# Patient Record
Sex: Male | Born: 1955 | Race: White | Hispanic: No | Smoking: Heavy tobacco smoker
Health system: Southern US, Community
[De-identification: ages and names within clinical notes are randomized; demographics above are authoritative.]

## PROBLEM LIST (undated history)

## (undated) DIAGNOSIS — E119 Type 2 diabetes mellitus without complications: Secondary | ICD-10-CM

## (undated) DIAGNOSIS — I10 Essential (primary) hypertension: Secondary | ICD-10-CM

## (undated) DIAGNOSIS — H269 Unspecified cataract: Secondary | ICD-10-CM

## (undated) HISTORY — PX: KNEE SURGERY: SHX244

## (undated) HISTORY — DX: Unspecified cataract: H26.9

## (undated) HISTORY — DX: Essential (primary) hypertension: I10

## (undated) HISTORY — DX: Type 2 diabetes mellitus without complications: E11.9

## (undated) HISTORY — PX: PROSTATE BIOPSY: SHX241

---

## 2015-02-25 ENCOUNTER — Emergency Department
Admission: EM | Admit: 2015-02-25 | Discharge: 2015-02-25 | Disposition: A | Discharge status: Home / Self Care | Payer: Managed Care, Other (non HMO) | Source: Home / Self Care | Attending: Family Medicine | Admitting: Family Medicine

## 2015-02-25 ENCOUNTER — Emergency Department (INDEPENDENT_AMBULATORY_CARE_PROVIDER_SITE_OTHER): Payer: Managed Care, Other (non HMO)

## 2015-02-25 DIAGNOSIS — R490 Dysphonia: Secondary | ICD-10-CM

## 2015-02-25 DIAGNOSIS — M5030 Other cervical disc degeneration, unspecified cervical region: Secondary | ICD-10-CM

## 2015-02-25 DIAGNOSIS — R030 Elevated blood-pressure reading, without diagnosis of hypertension: Secondary | ICD-10-CM

## 2015-02-25 DIAGNOSIS — IMO0001 Reserved for inherently not codable concepts without codable children: Secondary | ICD-10-CM

## 2015-02-25 MED ORDER — LISINOPRIL 20 MG PO TABS: 20.0000 mg | ORAL_TABLET | Freq: Every day | ORAL | Status: DC

## 2015-02-25 NOTE — ED Provider Notes (Signed)
CSN: [id number]     Arrival date & time 02/25/15  1507 History   First MD Initiated Contact with Patient 02/25/15 1510     Chief Complaint  Patient presents with  . Hoarse   (Consider location/radiation/quality/duration/timing/severity/associated sxs/prior Treatment) HPI Pt is a 60yo male presenting to [facility] with c/ow hoarseness to his voice for 1 week. Pt states about 1 week ago he drank water that went down the "wrong way" which caused him to cough violently. Pt states the next morning he did not have a voice. He denies any other symptoms. Denies throat pain. Denies cough, congestion, difficulty breathing or swallowing. No sick contacts or recent travel. Denies recent yelling or screaming.  History reviewed. No pertinent past medical history. Past Surgical History  Procedure Laterality Date  . Knee surgery      age 37   Family History  Problem Relation Age of Onset  . Diabetes Mother    Social History  Substance Use Topics  . Smoking status: Heavy Tobacco Smoker -- 5.00 packs/day for 0 years    Types: Cigars  . Smokeless tobacco: None  . Alcohol Use: No    Review of Systems  Constitutional: Negative for fever, chills, diaphoresis, appetite change and fatigue.  HENT: Positive for voice change. Negative for congestion, postnasal drip, rhinorrhea, sinus pressure, sore throat and trouble swallowing.   Respiratory: Positive for choking ( choked on water 1 week ago). Negative for cough, chest tightness, shortness of breath, wheezing and stridor.   Cardiovascular: Negative for chest pain and palpitations.  Gastrointestinal: Negative for nausea, vomiting, abdominal pain and diarrhea.  Musculoskeletal: Negative for myalgias and back pain.    Allergies  Review of patient's allergies indicates no known allergies.  Home Medications   Prior to Admission medications   Medication Sig Start Date End Date Taking? Authorizing Provider  lisinopril (PRINIVIL,ZESTRIL) 20 MG tablet Take 1  tablet (20 mg total) by mouth daily. 02/25/15   Noland Fordyce, PA-C   BP 195/108 mmHg  Pulse 82  Temp(Src) 98.4 F (36.9 C) (Oral)  Ht 5\' 8"  (1.727 m)  Wt 212 lb (96.163 kg)  BMI 32.24 kg/m2  SpO2 97% Physical Exam  Constitutional: He appears well-developed and well-nourished.  HENT:  Head: Normocephalic and atraumatic.  Right Ear: Hearing, tympanic membrane, external ear and ear canal normal.  Left Ear: Hearing, tympanic membrane, external ear and ear canal normal.  Nose: Nose normal.  Mouth/Throat: Uvula is midline and mucous membranes are normal. Posterior oropharyngeal erythema present. No oropharyngeal exudate, posterior oropharyngeal edema or tonsillar abscesses.  Eyes: Conjunctivae are normal. No scleral icterus.  Neck: Normal range of motion. Neck supple. No JVD present. No tracheal deviation present. No thyromegaly present.  No stridor but hoarse/squeaking quality of voice when speaking.  Cardiovascular: Normal rate, regular rhythm and normal heart sounds.   Pulmonary/Chest: Effort normal and breath sounds normal. No stridor. No respiratory distress. He has no wheezes. He has no rales. He exhibits no tenderness.  Abdominal: Soft. He exhibits no distension. There is no tenderness.  Musculoskeletal: Normal range of motion.  Lymphadenopathy:    He has no cervical adenopathy.  Neurological: He is alert.  Skin: Skin is warm and dry.  Nursing note and vitals reviewed.   ED Course  Procedures (including critical care time) Labs Review Labs Reviewed - No data to display  Imaging Review Dg Neck Soft Tissue  02/25/2015   CLINICAL DATA:  Hoarseness, lost a scoliosis for a few days, feels like neck  a swollen, trouble swallowing liquids every 4-5 days, history smoking, hypertension  EXAM: NECK SOFT TISSUES - 1+ VIEW  COMPARISON:  None  FINDINGS: Prevertebral soft tissues normal thickness.  Epiglottis and aryepiglottic folds normal thickness.  Airway patent.  Scattered degenerative  disc disease changes thoracic spine.  Atherosclerotic calcification aorta.  Calcified RIGHT paratracheal adenopathy.  IMPRESSION: Degenerative disc disease changes cervical spine.  No acute soft tissue abnormalities.   Electronically Signed   By: Lavonia Dana M.D.   On: 02/25/2015 15:54   Dg Chest 2 View  02/25/2015   CLINICAL DATA:  Hoarseness for several days. Difficulty swallowing fluids for the last 4-5 days.  EXAM: CHEST  2 VIEW  COMPARISON:  None.  FINDINGS: There is a eventration of the right hemidiaphragm.  Cardiomediastinal silhouette is normal. Mediastinal contours appear intact. There are high density mildly prominent lymph nodes in the right hilum.  There is no evidence of focal airspace consolidation, pleural effusion or pneumothorax.  Osseous structures are without acute abnormality. Soft tissues are grossly normal.  IMPRESSION: Eventration of the right hemidiaphragm.  Mildly prominent right hilar lymph nodes, possibly calcified. Further evaluation with CT of the thorax may be considered if found clinically necessary.   Electronically Signed   By: Fidela Salisbury M.D.   On: 02/25/2015 15:53     MDM   1. Elevated blood pressure   2. Hoarseness of voice    Pt c/o hoarse voice for 1 week after choking on water. No other symptoms. Denies difficulty breathing or swallowing.  Pt also noted to have elevated BP at 195/108.  Pt states he has not seen a PCP in 25 years.  Denies CP or SOB. No daily medications.  Plain films of soft tissue neck and CXR: negative for acute cardiopulmonary findings. No evidence of airway obstruction.  Pt likely has laryngitis from viscous cough after choking on water. Encouraged voice rest. May drink warm liquids like tea to help sooth throat. Will start pt on Lisinopril for elevated BP. Strongly encouraged pt establish care with a PCP for recheck of BP in 1-2 weeks and PCP to f/u on pt's voice.   Discussed symptoms that warrant emergent care in the  ED. Patient verbalized understanding and agreement with treatment plan.      Noland Fordyce, PA-C 02/25/15 1654

## 2015-02-25 NOTE — Discharge Instructions (Signed)
Please call to schedule a follow up appointment with primary care for blood pressure recheck in 1-2 weeks as well as for ongoing healthcare needs.

## 2015-02-25 NOTE — ED Notes (Signed)
To x-ray

## 2015-02-25 NOTE — ED Notes (Signed)
Lost voice x 1 week, drank water went down wrong, started coughing, and the next morning did not have a voice

## 2015-03-01 ENCOUNTER — Telehealth: Payer: Self-pay

## 2015-03-19 ENCOUNTER — Encounter: Payer: Self-pay | Admitting: Osteopathic Medicine

## 2015-03-19 ENCOUNTER — Ambulatory Visit (INDEPENDENT_AMBULATORY_CARE_PROVIDER_SITE_OTHER): Payer: Managed Care, Other (non HMO) | Admitting: Osteopathic Medicine

## 2015-03-19 VITALS — BP 158/84 | HR 87 | Ht 68.0 in | Wt 210.0 lb

## 2015-03-19 DIAGNOSIS — Z131 Encounter for screening for diabetes mellitus: Secondary | ICD-10-CM | POA: Diagnosis not present

## 2015-03-19 DIAGNOSIS — I10 Essential (primary) hypertension: Secondary | ICD-10-CM | POA: Diagnosis not present

## 2015-03-19 DIAGNOSIS — Z13220 Encounter for screening for lipoid disorders: Secondary | ICD-10-CM | POA: Diagnosis not present

## 2015-03-19 DIAGNOSIS — Z87891 Personal history of nicotine dependence: Secondary | ICD-10-CM

## 2015-03-19 DIAGNOSIS — Z716 Tobacco abuse counseling: Secondary | ICD-10-CM

## 2015-03-19 DIAGNOSIS — R490 Dysphonia: Secondary | ICD-10-CM | POA: Insufficient documentation

## 2015-03-19 DIAGNOSIS — Z720 Tobacco use: Secondary | ICD-10-CM

## 2015-03-19 DIAGNOSIS — Z114 Encounter for screening for human immunodeficiency virus [HIV]: Secondary | ICD-10-CM

## 2015-03-19 DIAGNOSIS — Z1159 Encounter for screening for other viral diseases: Secondary | ICD-10-CM

## 2015-03-19 LAB — COMPLETE METABOLIC PANEL WITH GFR
ALBUMIN: 4.3 g/dL (ref 3.6–5.1)
ALK PHOS: 69 U/L (ref 40–115)
ALT: 17 U/L (ref 9–46)
AST: 15 U/L (ref 10–35)
BILIRUBIN TOTAL: 0.5 mg/dL (ref 0.2–1.2)
BUN: 25 mg/dL (ref 7–25)
CALCIUM: 10.1 mg/dL (ref 8.6–10.3)
CO2: 24 mmol/L (ref 20–31)
CREATININE: 1.17 mg/dL (ref 0.70–1.33)
Chloride: 102 mmol/L (ref 98–110)
GFR, Est African American: 79 mL/min (ref >=60)
GFR, Est Non African American: 68 mL/min (ref >=60)
GLUCOSE: 198 mg/dL — AB (ref 65–99)
Potassium: 5.2 mmol/L (ref 3.5–5.3)
SODIUM: 136 mmol/L (ref 135–146)
TOTAL PROTEIN: 7.7 g/dL (ref 6.1–8.1)

## 2015-03-19 LAB — LIPID PANEL
Cholesterol: 208 mg/dL — ABNORMAL HIGH (ref 125–200)
HDL: 28 mg/dL — ABNORMAL LOW (ref >=40)
LDL CALC: 132 mg/dL — AB (ref <130)
TRIGLYCERIDES: 241 mg/dL — AB (ref <150)
Total CHOL/HDL Ratio: 7.4 Ratio — ABNORMAL HIGH (ref <=5.0)
VLDL: 48 mg/dL — AB (ref <30)

## 2015-03-19 LAB — CBC WITH DIFFERENTIAL/PLATELET
BASOS PCT: 1 % (ref 0–1)
Basophils Absolute: 0.1 10*3/uL (ref 0.0–0.1)
Eosinophils Absolute: 0.3 10*3/uL (ref 0.0–0.7)
Eosinophils Relative: 3 % (ref 0–5)
HEMATOCRIT: 49.3 % (ref 39.0–52.0)
HEMOGLOBIN: 17.1 g/dL — AB (ref 13.0–17.0)
LYMPHS PCT: 18 % (ref 12–46)
Lymphs Abs: 1.8 10*3/uL (ref 0.7–4.0)
MCH: 30 pg (ref 26.0–34.0)
MCHC: 34.7 g/dL (ref 30.0–36.0)
MCV: 86.5 fL (ref 78.0–100.0)
MONO ABS: 0.8 10*3/uL (ref 0.1–1.0)
MPV: 10.5 fL (ref 8.6–12.4)
Monocytes Relative: 8 % (ref 3–12)
NEUTROS ABS: 7.1 10*3/uL (ref 1.7–7.7)
NEUTROS PCT: 70 % (ref 43–77)
Platelets: 317 10*3/uL (ref 150–400)
RBC: 5.7 MIL/uL (ref 4.22–5.81)
RDW: 13.5 % (ref 11.5–15.5)
WBC: 10.2 10*3/uL (ref 4.0–10.5)

## 2015-03-19 LAB — HEMOGLOBIN A1C
HEMOGLOBIN A1C: 11.3 % — AB (ref <5.7)
MEAN PLASMA GLUCOSE: 278 mg/dL — AB (ref <117)

## 2015-03-19 LAB — TSH: TSH: 1.049 u[IU]/mL (ref 0.350–4.500)

## 2015-03-19 MED ORDER — PREDNISONE 10 MG (21) PO TBPK: ORAL_TABLET | ORAL | Status: DC

## 2015-03-19 NOTE — Progress Notes (Signed)
HPI: Jesse Calhoun is a 59 y.o. male who presents to [facility]  today for chief complaint of:  Chief Complaint  Patient presents with  . Establish Care    pcp  . Hypertension  . Hoarse    HOARSENESS . Severity: marked, "I sound like Marge Simpson" . Duration: 3 weeks . Timing: constant . Context: started with drinking water, sudden onset . Modifying factors: nothing makes better/worse . Assoc signs/symptoms: no pain/infection  HTN - hypertension newly diagnosed 3 weeks ago at urgent care, no chest pain/pressure, no shortness of breath. Patient tolerating lisinopril well.  Has not seen a physician in over 20 years, no blood work and record. No other complaints today.   Past medical, social and family history reviewed: No past medical history on file. Past Surgical History  Procedure Laterality Date  . Knee surgery      age 30   Social History  Substance Use Topics  . Smoking status: Heavy Tobacco Smoker -- 5.00 packs/day for 0 years    Types: Cigars  . Smokeless tobacco: Not on file  . Alcohol Use: No   Family History  Problem Relation Age of Onset  . Diabetes Mother     Current Outpatient Prescriptions  Medication Sig Dispense Refill  . lisinopril (PRINIVIL,ZESTRIL) 20 MG tablet Take 1 tablet (20 mg total) by mouth daily. 30 tablet 0   No current facility-administered medications for this visit.   No Known Allergies    Review of Systems: CONSTITUTIONAL: Neg fever/chills, no unintentional weight changes HEAD/EYES/EARS/NOSE/THROAT: No headache/vision change or hearing change, no sore throat. (+) HOARSENESS AS PER HPI, cataracts CARDIAC: No chest pain/pressure/palpitations, no orthopnea RESPIRATORY: No cough/shortness of breath/wheeze GASTROINTESTINAL: No nausea/vomiting/abdominal pain/blood in stool/diarrhea/constipation MUSCULOSKELETAL: No myalgia/arthralgia GENITOURINARY: No incontinence, No abnormal genital  bleeding/discharge SKIN: No rash/wounds/concerning lesions HEM/ONC: No easy bruising/bleeding, no abnormal lymph node ENDOCRINE: No polyuria/polydipsia/polyphagia, no heat/cold intolerance  NEUROLOGIC: No weakness/dizzines/slurred speech PSYCHIATRIC: No concerns with depression/anxiety or sleep problems    Exam:  BP 158/84 mmHg  Pulse 87  Ht 5\' 8"  (1.727 m)  Wt 210 lb (95.255 kg)  BMI 31.94 kg/m2  SpO2 98% Constitutional: VSS, see above. General Appearance: alert, well-developed, well-nourished, NAD. Hoarse voice, higher pitch vibratory quality to voice Eyes: Normal lids and conjunctive, non-icteric sclera, PERRLA Ears, Nose, Mouth, Throat: Normal external inspection ears/nares/mouth/lips/gums, Normal TM bilaterally, MMM, posterior pharynx without erythema/exudate Neck: No masses, trachea midline. No thyroid enlargement/tenderness/mass appreciated Respiratory: Normal respiratory effort. No dullness/hyper-resonance to percussion. Breath sounds normal, no wheeze/rhonchi/rales Cardiovascular: S1/S2 normal, no murmur/rub/gallop auscultated. RRR. No carotid bruit or JVD. No abdominal aortic bruit. Pedal pulse II/IV bilaterally DP and PT. No lower extremity edema. Gastrointestinal: Nontender, no masses. No hepatomegaly, no splenomegaly. No hernia appreciated. Rectal exam deferred.  Musculoskeletal: Gait normal. No clubbing/cyanosis of digits.  Neurological: No cranial nerve deficit on limited exam. Motor and sensation intact and symmetric Psychiatric: Normal judgment/insight. Normal mood and affect. Oriented x3.    No results found for this or any previous visit (from the past 72 hour(s)).    ASSESSMENT/PLAN:  Essential hypertension - Plan: CBC with Differential/Platelet, COMPLETE METABOLIC PANEL WITH GFR, Lipid panel, Hemoglobin A1C  Hoarseness of voice - Plan: TSH, predniSONE (STERAPRED UNI-PAK 21 TAB) 10 MG (21) TBPK tablet, Ambulatory referral to ENT  Lipid screening  Diabetes  mellitus screening - Plan: Hemoglobin A1C  Tobacco abuse  Tobacco abuse counseling  Personal history of nicotine dependence  Need for hepatitis C screening test -  Plan: Hepatitis C antibody  Screening for HIV (human immunodeficiency virus) - Plan: HIV antibody  Trial steroid pack to see if this helps for hoarseness, given his ongoing symptoms we'll go ahead and place referral for ear nose and throat, requests recommendations for visualization with soap and an officer over versus whatever imaging would be best to evaluate this given that the patient needs chest CT anyway for lung cancer screening. Obtain baseline labs for medication monitoring and screening and 59 year old hypertensive male. Patient to follow-up in one week to recheck symptoms, go over lab results, recheck blood pressure. We'll hold off on adjusting medications until we have blood work results back as this may guide prescription decision. ER precautions reviewed regarding chest pain or difficulty breathing, other concerns.

## 2015-03-20 LAB — HEPATITIS C ANTIBODY: HCV AB: NEGATIVE

## 2015-03-20 LAB — HIV ANTIBODY (ROUTINE TESTING W REFLEX): HIV 1&2 Ab, 4th Generation: NONREACTIVE

## 2015-03-26 ENCOUNTER — Encounter: Payer: Self-pay | Admitting: Osteopathic Medicine

## 2015-03-26 ENCOUNTER — Other Ambulatory Visit: Payer: Self-pay | Admitting: Osteopathic Medicine

## 2015-03-26 ENCOUNTER — Ambulatory Visit: Payer: Managed Care, Other (non HMO) | Admitting: Osteopathic Medicine

## 2015-03-26 ENCOUNTER — Ambulatory Visit (INDEPENDENT_AMBULATORY_CARE_PROVIDER_SITE_OTHER): Payer: Managed Care, Other (non HMO) | Admitting: Osteopathic Medicine

## 2015-03-26 VITALS — BP 158/90 | HR 85 | Wt 207.0 lb

## 2015-03-26 DIAGNOSIS — E1165 Type 2 diabetes mellitus with hyperglycemia: Secondary | ICD-10-CM

## 2015-03-26 DIAGNOSIS — I10 Essential (primary) hypertension: Secondary | ICD-10-CM | POA: Diagnosis not present

## 2015-03-26 DIAGNOSIS — E119 Type 2 diabetes mellitus without complications: Secondary | ICD-10-CM

## 2015-03-26 DIAGNOSIS — E785 Hyperlipidemia, unspecified: Secondary | ICD-10-CM | POA: Diagnosis not present

## 2015-03-26 MED ORDER — METFORMIN HCL 1000 MG PO TABS: 1000.0000 mg | ORAL_TABLET | Freq: Two times a day (BID) | ORAL | Status: DC

## 2015-03-26 MED ORDER — LISINOPRIL 20 MG PO TABS: 40.0000 mg | ORAL_TABLET | Freq: Every day | ORAL | Status: DC

## 2015-03-26 MED ORDER — ATORVASTATIN CALCIUM 40 MG PO TABS: 40.0000 mg | ORAL_TABLET | Freq: Every day | ORAL | Status: DC

## 2015-03-26 MED ORDER — INSULIN GLARGINE 100 UNIT/ML SOLOSTAR PEN: 10.0000 [IU] | PEN_INJECTOR | SUBCUTANEOUS | Status: DC

## 2015-03-26 NOTE — Progress Notes (Signed)
HPI: Jesse Calhoun is a 59 y.o. male who presents to [facility]  today for chief complaint of:  Chief Complaint  Patient presents with  . Diabetes    new diagnosis   Diabetes: This is a new diagnosis the patient, uncontrolled diabetes with A1c greater than 11. Patient is also having symptoms of polyuria and weight loss.  Hypertension: No improvement on medication, no chest pain or pressure   Past medical, social and family history reviewed: No past medical history on file. Past Surgical History  Procedure Laterality Date  . Knee surgery      age 49   Social History  Substance Use Topics  . Smoking status: Heavy Tobacco Smoker -- 5.00 packs/day for 0 years    Types: Cigars  . Smokeless tobacco: Not on file  . Alcohol Use: No   Family History  Problem Relation Age of Onset  . Diabetes Mother     Current Outpatient Prescriptions  Medication Sig Dispense Refill  . lisinopril (PRINIVIL,ZESTRIL) 20 MG tablet Take 1 tablet (20 mg total) by mouth daily. 30 tablet 0  . predniSONE (STERAPRED UNI-PAK 21 TAB) 10 MG (21) TBPK tablet 12 day taper pack, use as directed 1 tablet 0  .      .      .       No current facility-administered medications for this visit.   No Known Allergies    Review of Systems: CONSTITUTIONAL: Neg fever/chills, positive weight loss CARDIAC: No chest pain/pressure/palpitations, no orthopnea RESPIRATORY: No cough/shortness of breath/wheeze GASTROINTESTINAL: No nausea/vomiting/abdominal pain/blood in stool/diarrhea/constipation ENDOCRINE: No polydipsia/polyphagia, no heat/cold intolerance, positive polyuria   Exam:  BP 158/90 mmHg  Pulse 85  Wt 207 lb (93.895 kg)  SpO2 99% Constitutional: VSS, see above. General Appearance: alert, well-developed, well-nourished, NAD Psychiatric: Normal judgment/insight. Normal mood and affect. Oriented x3.    No results found for this or any previous visit (from the past 72  hour(s)).    ASSESSMENT/PLAN:  Poorly controlled type 2 diabetes mellitus - Plan: Insulin Glargine (LANTUS SOLOSTAR) 100 UNIT/ML Solostar Pen, atorvastatin (LIPITOR) 40 MG tablet, metFORMIN (GLUCOPHAGE) 1000 MG tablet  Hyperlipidemia - Plan: atorvastatin (LIPITOR) 40 MG tablet  Essential hypertension - Plan: lisinopril (PRINIVIL,ZESTRIL) 20 MG tablet increased to 40 mg daily, patient can double up on pills until he runs out, then we'll call in new prescription.  Greater than 50% of this 30 minute visit was spent counseling patient and coordination of care for new diagnosis of diabetes, poorly controlled.  Patient was counseled on regimen to improve sugar control, prescription given as above for testing supplies and for insulin and metformin as well as statin. Once he has the least prescriptions he is to return to clinic for a nurse visit for instructions on how to use testing supplies and pens.  Patient counseled on diet and exercise to improve blood sugar control and reduce cardiovascular risk.  We briefly discussed routine monitoring care and diabetic patient, including routine lab work and checking A1c every 3 months, but exam, ophthalmology exam, immunizations. We will follow up on these at further visits. This visit was mainly spent educating patient on diagnosis and treatment as well as natural history of diabetes disease. All of the patient's questions  were answered and he was encouraged to call us if any additional questions or concerns.   Patient counseled on possible side effects of medication. Patient counseled on what to watch for for hypoglycemic symptoms. Patient counseled on titration  of insulin and metformin & patient counseled on importance of follow-up.

## 2015-03-26 NOTE — Patient Instructions (Addendum)
A new diagnosis of diabetes can be challenging, but we are here to help if you have any questions please let us know.  You have been given prescriptions for glucose testing supplies, including glucometer, test strips, lancets. When you have obtained to be supplies, please come to the office so a nurse can show him how to use these properly. You should be checking your blood sugar every morning before you eat breakfast. You should write these numbers down and bring them to call your doctor visits. Any time you feel ill or like your blood sugar is running low, you should also check a level and write it down.  You have been given a prescription for long-acting insulin to use once daily. We will start at 10 units per day, increasing by 3 units every 3 days. Our goal is to get your fasting blood sugar to less than 130. If your fasting blood sugar drops below 90, call the office and reduced your insulin dose by 3 units until told to do otherwise by your doctor.  You have been given a prescription for metformin pill. Your goal is 1000 mg twice per day per day, however we will start at a lower dose to minimize side effects of diarrhea/loose stool. You can start at one half tablet daily and increase as tolerated.   We will talk more and other appointments about further routine testing required for diabetics, including foot exam in visits to the eye doctor, and other routine blood work monitoring. If you ever have any questions please call the office and I will be in touch with you ASAP.     Blood Glucose Monitoring Monitoring your blood glucose (also know as blood sugar) helps you to manage your diabetes. It also helps you and your health care provider monitor your diabetes and determine how well your treatment plan is working. WHY SHOULD YOU MONITOR YOUR BLOOD GLUCOSE?  It can help you understand how food, exercise, and medicine affect your blood glucose.  It allows you to know what your blood glucose is  at any given moment. You can quickly tell if you are having low blood glucose (hypoglycemia) or high blood glucose (hyperglycemia).  It can help you and your health care provider know how to adjust your medicines.  It can help you understand how to manage an illness or adjust medicine for exercise. WHEN SHOULD YOU TEST? Your health care provider will help you decide how often you should check your blood glucose. This may depend on the type of diabetes you have, your diabetes control, or the types of medicines you are taking. Be sure to write down all of your blood glucose readings so that this information can be reviewed with your health care provider. See below for examples of testing times that your health care provider may suggest. [place] Needed  Blood glucose meter.  Test strips for your meter. Each meter has its own strips. You must use the strips that go with your own meter.  A pricking needle (lancet).  A device that holds the lancet (lancing device).  A journal or log book to write down your results. Procedure  Wash your hands with soap and water. Alcohol is not preferred.  Prick the side of your finger (not the tip) with the lancet.  Gently milk the finger until a small drop of blood appears.  Follow the instructions that come with your meter for inserting the test strip, applying blood to  the strip, and using your blood glucose meter. Other Areas to Get Blood for Testing Some meters allow you to use other areas of your body (other than your finger) to test your blood. These areas are called alternative sites. The most common alternative sites are:  The forearm.  The thigh.  The back area of the lower leg.  The palm of the hand. The blood flow in these areas is slower. Therefore, the blood glucose values you get may be delayed, and the numbers are different from what you would get from your fingers. Do not use alternative sites if you  think you are having hypoglycemia. Your reading will not be accurate. Always use a finger if you are having hypoglycemia. Also, if you cannot feel your lows (hypoglycemia unawareness), always use your fingers for your blood glucose checks. ADDITIONAL TIPS FOR GLUCOSE MONITORING  Do not reuse lancets.  Always carry your supplies with you.  All blood glucose meters have a 24-hour "hotline" number to call if you have questions or need help.  Adjust (calibrate) your blood glucose meter with a control solution after finishing a few boxes of strips. BLOOD GLUCOSE RECORD KEEPING It is a good idea to keep a daily record or log of your blood glucose readings. Most glucose meters, if not all, keep your glucose records stored in the meter. Some meters come with the ability to download your records to your home computer. Keeping a record of your blood glucose readings is especially helpful if you are wanting to look for patterns. Make notes to go along with the blood glucose readings because you might forget what happened at that exact time. Keeping good records helps you and your health care provider to work together to achieve good diabetes management.  Document Released: 06/24/2003 Document Revised: 11/05/2013 Document Reviewed: 11/13/2012 [facility] Patient Information 2015 [place], [place]. This information is not intended to replace advice given to you by your health care provider. Make sure you discuss any questions you have with your health care provider.  How and Where to Give Subcutaneous Insulin Injections, Adult People with type 1 diabetes must take insulin since their bodies do not make it. People with type 2 diabetes may require insulin. There are many different types of insulin as well as other injectable diabetes medicines that are meant to be injected into the fat layer under your skin. The type of insulin or injectable diabetes medicine you take may determine how many injections you give yourself  and when to take the injections.  CHOOSING A SITE FOR INJECTION Insulin absorption varies from site to site. As with any injectable medication it is best for the insulin to be injected within the same body region. However, do not inject the insulin in the same spot each time. Rotating the spots you give your injections will prevent inflammation or tissue breakdown. There are four main regions that can be used for injections. The regions include the:  Abdomen (preferred region, especially for non-insulin injectable diabetes medicine).  Front and upper outer sides of thighs.  Back of upper arm.  Buttocks.   USING INSULIN PENS  Wash your hands with soap and water.  If you are using the "cloudy" insulin, roll the pen between your palms several times or rotate the pen top to bottom several times.  Remove the insulin pen cap.  Clean the rubber stopper of the cartridge with an alcohol wipe.  Remove the protective paper tab from the disposable needle.  Screw the needle  onto the pen.  Remove the outer plastic needle cover.  Remove the inner plastic needle cover.  Prime the insulin pen by turning the button (dial) to 2 units. Hold the pen with the needle pointing up, and push the dial on the opposite end until a drop of insulin appears at the needle tip. If no insulin appears, repeat this step.  Dial the number of units of insulin you will inject.  Use an alcohol wipe to clean the area of the body to be injected.  Pinch up 1 inch of skin and hold it.  Put the needle straight into the skin (90-degree angle).  Push the dial down to push the insulin into the fat tissue.  Count to 10 slowly. Then, remove the needle from the fat tissue.  Carefully replace the larger outer plastic needle cover over the needle and unscrew the capped needle. THROWING AWAY SUPPLIES  Discard used needles in a puncture proof sharps disposal container. Follow disposal regulations for the area where you  live.  Vials and empty disposable pens may be thrown away in the regular trash. Document Released: 09/11/2003 Document Revised: 11/05/2013 Document Reviewed: 11/28/2012 [facility] Patient Information 2015 [place], [place]. This information is not intended to replace advice given to you by your health care provider. Make sure you discuss any questions you have with your health care provider.   Basic Carbohydrate Counting for Diabetes Mellitus Carbohydrate counting is a method for keeping track of the amount of carbohydrates you eat. Eating carbohydrates naturally increases the level of sugar (glucose) in your blood, so it is important for you to know the amount that is okay for you to have in every meal. Carbohydrate counting helps keep the level of glucose in your blood within normal limits. The amount of carbohydrates allowed is different for every person. A dietitian can help you calculate the amount that is right for you. Once you know the amount of carbohydrates you can have, you can count the carbohydrates in the foods you want to eat. Carbohydrates are found in the following foods:  Grains, such as breads and cereals.  Dried beans and soy products.  Starchy vegetables, such as potatoes, peas, and corn.  Fruit and fruit juices.  Milk and yogurt.  Sweets and snack foods, such as cake, cookies, candy, chips, soft drinks, and fruit drinks. CARBOHYDRATE COUNTING There are two ways to count the carbohydrates in your food. You can use either of the methods or a combination of both. Reading the "Nutrition Facts" on [place] The "Nutrition Facts" is an area that is included on the labels of almost all packaged food and beverages in the [place]. It includes the serving size of that food or beverage and information about the nutrients in each serving of the food, including the grams (g) of carbohydrate per serving.  Decide the number of servings of this food or beverage that you will be  able to eat or drink. Multiply that number of servings by the number of grams of carbohydrate that is listed on the label for that serving. The total will be the amount of carbohydrates you will be having when you eat or drink this food or beverage. Learning Standard Serving Sizes of Food When you eat food that is not packaged or does not include "Nutrition Facts" on the label, you need to measure the servings in order to count the amount of carbohydrates.A serving of most carbohydrate-rich foods contains about 15 g of carbohydrates. The following list  includes serving sizes of carbohydrate-rich foods that provide 15 g ofcarbohydrate per serving:   1 slice of bread (1 oz) or 1 six-inch tortilla.    of a hamburger bun or English muffin.  4-6 crackers.   cup unsweetened dry cereal.    cup hot cereal.   cup rice or pasta.    cup mashed potatoes or  of a large baked potato.  1 cup fresh fruit or one small piece of fruit.    cup canned or frozen fruit or fruit juice.  1 cup milk.   cup plain fat-free yogurt or yogurt sweetened with artificial sweeteners.   cup cooked dried beans or starchy vegetable, such as peas, corn, or potatoes.  Decide the number of standard-size servings that you will eat. Multiply that number of servings by 15 (the grams of carbohydrates in that serving). For example, if you eat 2 cups of strawberries, you will have eaten 2 servings and 30 g of carbohydrates (2 servings x 15 g = 30 g). For foods such as soups and casseroles, in which more than one food is mixed in, you will need to count the carbohydrates in each food that is included. EXAMPLE OF CARBOHYDRATE COUNTING Sample Dinner  3 oz chicken breast.   cup of brown rice.   cup of corn.  1 cup milk.   1 cup strawberries with sugar-free whipped topping.  Carbohydrate Calculation Step 1: Identify the foods that contain carbohydrates:   Rice.   Corn.   Milk.    Strawberries. Step 2:Calculate the number of servings eaten of each:   2 servings of rice.   1 serving of corn.   1 serving of milk.   1 serving of strawberries. Step 3: Multiply each of those number of servings by 15 g:   2 servings of rice x 15 g = 30 g.   1 serving of corn x 15 g = 15 g.   1 serving of milk x 15 g = 15 g.   1 serving of strawberries x 15 g = 15 g. Step 4: Add together all of the amounts to find the total grams of carbohydrates eaten: 30 g + 15 g + 15 g + 15 g = 75 g. Document Released: 06/21/2005 Document Revised: 11/05/2013 Document Reviewed: 05/18/2013 [facility] Patient Information 2015 [place], [place]. This information is not intended to replace advice given to you by your health care provider. Make sure you discuss any questions you have with your health care provider.

## 2015-03-27 ENCOUNTER — Ambulatory Visit (INDEPENDENT_AMBULATORY_CARE_PROVIDER_SITE_OTHER): Payer: Managed Care, Other (non HMO) | Admitting: Osteopathic Medicine

## 2015-03-27 VITALS — BP 171/91 | HR 105

## 2015-03-27 DIAGNOSIS — E119 Type 2 diabetes mellitus without complications: Secondary | ICD-10-CM

## 2015-03-27 DIAGNOSIS — I10 Essential (primary) hypertension: Secondary | ICD-10-CM | POA: Diagnosis not present

## 2015-03-28 ENCOUNTER — Encounter: Payer: Self-pay | Admitting: Osteopathic Medicine

## 2015-03-28 LAB — POCT URINALYSIS DIPSTICK
Bilirubin, UA: NEGATIVE
GLUCOSE UA: 500
Ketones, UA: NEGATIVE
LEUKOCYTES UA: NEGATIVE
NITRITE UA: NEGATIVE
PROTEIN UA: NEGATIVE
SPEC GRAV UA: 1.015
UROBILINOGEN UA: 0.2
pH, UA: 5

## 2015-03-28 LAB — POCT UA - MICROALBUMIN
Albumin/Creatinine Ratio, Urine, POC: <30
CREATININE, POC: 100 mg/dL
MICROALBUMIN (UR) POC: 30 mg/L

## 2015-03-28 NOTE — Progress Notes (Signed)
Pt here for nurse visit, see below.  Hyperglycemia, see below. Pt feeling well other than anxious about needles, anxious about high sugars.  ROS: No CP/SOB Still some polyuria No N/V, no abd pain  PE: Alert, oriented, NAD  Results for orders placed or performed in visit on 03/27/15 (from the past 72 hour(s))  POCT Urinalysis Dipstick     Status: Abnormal   Collection Time: 03/28/15 10:54 AM  Result Value Ref Range   Color, UA yellow    Clarity, UA clear    Glucose, UA 500    Bilirubin, UA negative    Ketones, UA negative    Spec Grav, UA 1.015    Blood, UA Moderate    pH, UA 5.0    Protein, UA negative    Urobilinogen, UA 0.2    Nitrite, UA negative    Leukocytes, UA Negative Negative  POCT UA - Microalbumin     Status: Normal   Collection Time: 03/28/15 10:55 AM  Result Value Ref Range   Microalbumin Ur, POC 30 mg/L   Creatinine, POC 100 mg/dL   Albumin/Creatinine Ratio, Urine, POC <30      A/P:  Type 2 diabetes mellitus without complication - Hyperglyecmia, urine ketones neg, see results note - low concern for DKA, ER precautions reviewed. Here today for education re: insulin admin, glucometer use - Plan: POCT Urinalysis Dipstick, POCT UA - Microalbumin  Essential hypertension - Pt anxious about sugars, will recheck, no CP/SOB, ER precautions reviewed   Total time spent 10 minutes, greater than 50% of the visit was counseling and coordinating care for diagnosis of diabetes, hypertensoin.

## 2015-04-09 ENCOUNTER — Ambulatory Visit (INDEPENDENT_AMBULATORY_CARE_PROVIDER_SITE_OTHER): Payer: Managed Care, Other (non HMO) | Admitting: Osteopathic Medicine

## 2015-04-09 VITALS — BP 130/78 | HR 79

## 2015-04-09 DIAGNOSIS — I10 Essential (primary) hypertension: Secondary | ICD-10-CM | POA: Diagnosis not present

## 2015-04-09 NOTE — Progress Notes (Signed)
   Subjective:    Patient ID: Jesse Calhoun, male    DOB: 1956-06-21, 59 y.o.   MRN: [id number]  HPI  Seiji is here for a BP check.   Review of Systems     Objective:   Physical Exam        Assessment & Plan:   Errin BP on 03/27/15 was 171/91 and today's BP is 130/78

## 2015-04-09 NOTE — Progress Notes (Signed)
Reviewed, continue current meds

## 2015-05-09 ENCOUNTER — Other Ambulatory Visit: Payer: Self-pay | Admitting: Otolaryngology

## 2015-05-09 ENCOUNTER — Telehealth: Payer: Self-pay | Admitting: Osteopathic Medicine

## 2015-05-09 ENCOUNTER — Other Ambulatory Visit: Payer: Self-pay | Admitting: Osteopathic Medicine

## 2015-05-09 DIAGNOSIS — J3800 Paralysis of vocal cords and larynx, unspecified: Secondary | ICD-10-CM

## 2015-05-09 DIAGNOSIS — F17200 Nicotine dependence, unspecified, uncomplicated: Secondary | ICD-10-CM

## 2015-05-09 DIAGNOSIS — E1165 Type 2 diabetes mellitus with hyperglycemia: Secondary | ICD-10-CM

## 2015-05-09 MED ORDER — INSULIN GLARGINE 100 UNIT/ML SOLOSTAR PEN: 45.0000 [IU] | PEN_INJECTOR | SUBCUTANEOUS | Status: DC

## 2015-05-09 NOTE — Telephone Encounter (Signed)
Received Rx request for insulin. Pt overdue for his followup for diabetes management. Please make sure he schedules this, knows to bring Glucose logs to this visit. I sent Rx Insulin but no additional refills until seen in office.

## 2015-05-09 NOTE — Telephone Encounter (Signed)
Called and spoke with Pt. Advised of need for visit and was transferred to scheduling. Prior to transfer, Pt was advised to keep a log of his blood sugars and bring that information into clinic at appt. verbalized understanding. No further questions.

## 2015-05-13 ENCOUNTER — Other Ambulatory Visit (HOSPITAL_COMMUNITY): Payer: Self-pay | Admitting: Otolaryngology

## 2015-05-13 DIAGNOSIS — J3800 Paralysis of vocal cords and larynx, unspecified: Secondary | ICD-10-CM

## 2015-05-13 DIAGNOSIS — F17200 Nicotine dependence, unspecified, uncomplicated: Secondary | ICD-10-CM

## 2015-05-13 NOTE — Telephone Encounter (Signed)
Noted, thanks!

## 2015-05-14 ENCOUNTER — Other Ambulatory Visit: Payer: Self-pay | Admitting: Osteopathic Medicine

## 2015-05-14 DIAGNOSIS — E1165 Type 2 diabetes mellitus with hyperglycemia: Secondary | ICD-10-CM

## 2015-05-14 NOTE — Progress Notes (Signed)
Pt was referred to ENT, who ordered at CT Chest and Neck with contrast. Updated lab work required, order placed.

## 2015-05-16 LAB — COMPLETE METABOLIC PANEL WITH GFR
ALBUMIN: 4.2 g/dL (ref 3.6–5.1)
ALK PHOS: 60 U/L (ref 40–115)
ALT: 13 U/L (ref 9–46)
AST: 14 U/L (ref 10–35)
BILIRUBIN TOTAL: 0.5 mg/dL (ref 0.2–1.2)
BUN: 17 mg/dL (ref 7–25)
CALCIUM: 9.7 mg/dL (ref 8.6–10.3)
CO2: 24 mmol/L (ref 20–31)
CREATININE: 1.12 mg/dL (ref 0.70–1.33)
Chloride: 102 mmol/L (ref 98–110)
GFR, Est African American: 83 mL/min (ref >=60)
GFR, Est Non African American: 72 mL/min (ref >=60)
Glucose, Bld: 128 mg/dL — ABNORMAL HIGH (ref 65–99)
POTASSIUM: 4.7 mmol/L (ref 3.5–5.3)
Sodium: 140 mmol/L (ref 135–146)
TOTAL PROTEIN: 7.2 g/dL (ref 6.1–8.1)

## 2015-05-20 ENCOUNTER — Other Ambulatory Visit: Payer: Managed Care, Other (non HMO)

## 2015-05-22 ENCOUNTER — Other Ambulatory Visit: Payer: Self-pay | Admitting: Osteopathic Medicine

## 2015-05-23 ENCOUNTER — Ambulatory Visit (INDEPENDENT_AMBULATORY_CARE_PROVIDER_SITE_OTHER): Payer: Managed Care, Other (non HMO) | Admitting: Osteopathic Medicine

## 2015-05-23 ENCOUNTER — Encounter: Payer: Self-pay | Admitting: Osteopathic Medicine

## 2015-05-23 ENCOUNTER — Ambulatory Visit (INDEPENDENT_AMBULATORY_CARE_PROVIDER_SITE_OTHER): Payer: Managed Care, Other (non HMO)

## 2015-05-23 VITALS — BP 189/88 | HR 206 | Ht 68.0 in | Wt 206.0 lb

## 2015-05-23 DIAGNOSIS — I10 Essential (primary) hypertension: Secondary | ICD-10-CM

## 2015-05-23 DIAGNOSIS — Z720 Tobacco use: Secondary | ICD-10-CM

## 2015-05-23 DIAGNOSIS — F17200 Nicotine dependence, unspecified, uncomplicated: Secondary | ICD-10-CM

## 2015-05-23 DIAGNOSIS — Z794 Long term (current) use of insulin: Secondary | ICD-10-CM

## 2015-05-23 DIAGNOSIS — J3800 Paralysis of vocal cords and larynx, unspecified: Secondary | ICD-10-CM | POA: Diagnosis not present

## 2015-05-23 DIAGNOSIS — E1165 Type 2 diabetes mellitus with hyperglycemia: Secondary | ICD-10-CM

## 2015-05-23 DIAGNOSIS — E119 Type 2 diabetes mellitus without complications: Secondary | ICD-10-CM | POA: Diagnosis not present

## 2015-05-23 MED ORDER — IOHEXOL 300 MG/ML  SOLN
75.0000 mL | Freq: Once | INTRAMUSCULAR | Status: AC | PRN
  Administered 2015-05-23: 75 mL via INTRAVENOUS

## 2015-05-23 MED ORDER — LISINOPRIL 40 MG PO TABS: 40.0000 mg | ORAL_TABLET | Freq: Every day | ORAL | Status: DC

## 2015-05-23 MED ORDER — METFORMIN HCL 1000 MG PO TABS: 1000.0000 mg | ORAL_TABLET | Freq: Two times a day (BID) | ORAL | Status: DC

## 2015-05-23 NOTE — Patient Instructions (Signed)
Metformin (Diabetes) pill - 1000 mg (one pill) twice per day (start at 1/2 pill and go up as tolerated, you may have loose stool and nausea for about a week but this should go away)   Lisinopril (blood pressure) pill - 40 mg (use up old pills, take 2 at a time, once per day)  Lipitor (cholesterol) - 40 mg (one pill) once per day  Insulin - 40 Units once per day  Probiotic - ok to take

## 2015-05-23 NOTE — Progress Notes (Signed)
HPI: Jesse Calhoun is a 59 y.o. male who presents to [facility]  today for chief complaint of:  Chief Complaint  Patient presents with  . Diabetes    Diabetes on insulin: Patient has sugar logs with him, fasting blood glucose 120s to 130s. He is currently on 39 units of insulin but doesn't always take this when he thinks his blood sugars are lower, typically 120. Despite previous instructions, he is not taking metformin, states that this was never filled, pharmacy records reviewed at this was sent. Patient is asked to ask his pharmacist about except. Patient advised on starting metformin. Advised patient need for vaccine update, declines today. Hasn't scheduled ophthalmology visit. He states he checked his A1c at work, I have no record of this but he states that it was 8.9.   Hypertension: Some confusion on how patient is taking his lisinopril, per my records he should be taking 40 mg daily, doubling up his 20 mg pills until these are out. However patient states that he thinks he is taking 20 mg twice a day. Blood pressure check 1 month ago was normal, but now high again. Question whether he is consistent with taking the medication. Denies chest pain, pressure, shortness of breath.   Hyperlipidemia: He is compliant with Lipitor daily.  Otherwise feeling well and no complaints today  Past medical, social and family history reviewed: No past medical history on file. Past Surgical History  Procedure Laterality Date  . Knee surgery      age 75   Social History  Substance Use Topics  . Smoking status: Heavy Tobacco Smoker -- 5.00 packs/day for 0 years    Types: Cigars  . Smokeless tobacco: Not on file  . Alcohol Use: No   Family History  Problem Relation Age of Onset  . Diabetes Mother     Current Outpatient Prescriptions  Medication Sig Dispense Refill  . atorvastatin (LIPITOR) 40 MG tablet Take 1 tablet (40 mg total) by mouth daily. 30 tablet 1  .  Insulin Glargine (LANTUS SOLOSTAR) 100 UNIT/ML Solostar Pen Inject 45 Units into the skin daily. Increase by 3 units every 3 days until fasting glucose is less than 130, then stay at that dose Insulin 15 mL 11  . lisinopril (PRINIVIL,ZESTRIL) 20 MG tablet Take 2 tablets (40 mg total) by mouth daily. 30 tablet 0  . metFORMIN (GLUCOPHAGE) 1000 MG tablet Take 1 tablet (1,000 mg total) by mouth 2 (two) times daily with a meal. (Can start at 1/2 tab daily and increase as tolerated) 180 tablet 3   No current facility-administered medications for this visit.   No Known Allergies    Review of Systems: CONSTITUTIONAL:  No  fever, no chills, No  unintentional weight changes CARDIAC: No chest pain, no pressure/palpitations, no orthopnea RESPIRATORY: No  cough, No  shortness of breath/wheeze ENDOCRINE: No polyuria/polydipsia/polyphagia,  Exam:  BP 189/88 mmHg  Pulse 206  Ht 5\' 8"  (1.727 m)  Wt 206 lb (93.441 kg)  BMI 31.33 kg/m2 verified by manual read by myself Constitutional: VSS, see above. General Appearance: alert, well-developed, well-nourished, NAD Respiratory: Normal respiratory effort. no wheeze, no rhonchi, no rales Cardiovascular: S1/S2 normal, no murmur, no rub/gallop auscultated. RRR.    No results found for this or any previous visit (from the past 72 hour(s)).    ASSESSMENT/PLAN:  Essential hypertension - Plan: lisinopril (PRINIVIL,ZESTRIL) 40 MG tablet  Type 2 diabetes mellitus without complication, with long-term current use of insulin (H[facility])  Poorly controlled type 2 diabetes mellitus ([place]) - Plan: metFORMIN (GLUCOPHAGE) 1000 MG tablet   See Patient instructions. Advised to continue 40 units of insulin daily, start metformin as directed, take lisinopril as directed, return 1 week for a visit with me to ensure medication compliance and to recheck blood pressure. Patient declines preventive care vaccines today, states he has another appointment to get to. ER precautions  reviewed, medication instructions reviewed in detail with the patient. He is advised to bring all of his pill bottle to his next appointment.   Return in about 1 week (around 05/30/2015) for OFFICE VISIT BLOOD PRESSURE CHECK WITH DR A.  Total time spent 25 minutes, greater than 50% of the visit was counseling and coordinating care for diagnosis of diabetes and high blood pressure.

## 2015-06-03 ENCOUNTER — Encounter: Payer: Self-pay | Admitting: Osteopathic Medicine

## 2015-06-03 ENCOUNTER — Ambulatory Visit (INDEPENDENT_AMBULATORY_CARE_PROVIDER_SITE_OTHER): Payer: Managed Care, Other (non HMO) | Admitting: Osteopathic Medicine

## 2015-06-03 VITALS — BP 125/80 | HR 90 | Ht 68.0 in | Wt 202.0 lb

## 2015-06-03 DIAGNOSIS — Z23 Encounter for immunization: Secondary | ICD-10-CM | POA: Diagnosis not present

## 2015-06-03 DIAGNOSIS — Z794 Long term (current) use of insulin: Secondary | ICD-10-CM

## 2015-06-03 DIAGNOSIS — I10 Essential (primary) hypertension: Secondary | ICD-10-CM

## 2015-06-03 DIAGNOSIS — E119 Type 2 diabetes mellitus without complications: Secondary | ICD-10-CM

## 2015-06-03 NOTE — Progress Notes (Signed)
HPI: Jesse Calhoun is a 59 y.o. male who presents to [facility]  today for chief complaint of:  Chief Complaint  Patient presents with  . Follow-up    blood pressure   Hypertension: Patient consistently taking lisinopril 40 mg daily, blood pressure rechecked manually, recorded as below, at goal. No chest pain pressure palpitations, no shortness of breath.  Diabetes: Patient taking 40 units of insulin daily as well as metformin twice a day, no hypoglycemic episodes, he states last A1c was taken at work about a month ago and was 8, I do not have records of this.  Preventive care: patient due for update of vaccine and diabetic patient particularly pneumonia vaccine as well as tetanus booster and flu shot for the year.  Past medical, social and family history reviewed: Past Medical History  Diagnosis Date  . Diabetes mellitus without complication ([place])   . Hypertension    Past Surgical History  Procedure Laterality Date  . Knee surgery      age 46   Social History  Substance Use Topics  . Smoking status: Heavy Tobacco Smoker -- 5.00 packs/day for 0 years    Types: Cigars  . Smokeless tobacco: Not on file  . Alcohol Use: No   Family History  Problem Relation Age of Onset  . Diabetes Mother     Current Outpatient Prescriptions  Medication Sig Dispense Refill  . atorvastatin (LIPITOR) 40 MG tablet Take 1 tablet (40 mg total) by mouth daily. 30 tablet 1  . Insulin Glargine (LANTUS SOLOSTAR) 100 UNIT/ML Solostar Pen Inject 45 Units into the skin daily. Increase by 3 units every 3 days until fasting glucose is less than 130, then stay at that dose Insulin 15 mL 11  . lisinopril (PRINIVIL,ZESTRIL) 40 MG tablet Take 1 tablet (40 mg total) by mouth daily. 30 tablet 3  . metFORMIN (GLUCOPHAGE) 1000 MG tablet Take 1 tablet (1,000 mg total) by mouth 2 (two) times daily with a meal. (Can start at 1/2 tab daily and increase as tolerated) 180 tablet 3   No  current facility-administered medications for this visit.   No Known Allergies    Review of Systems: CONSTITUTIONAL:  No  fever, no chills,  CARDIAC: No chest pain, no pressure/palpitations, no orthopnea RESPIRATORY: No  cough, No  shortness of breath/wheeze GASTROINTESTINAL: No nausea, no vomiting, no abdominal pain, ENDOCRINE: No polyuria/polydipsia/polyphagia, no heat/cold intolerance   Exam:  BP 125/80 mmHg  Pulse 90  Ht 5\' 8"  (1.727 m)  Wt 202 lb (91.627 kg)  BMI 30.72 kg/m2 Constitutional: VSS, see above. General Appearance: alert, well-developed, well-nourished, NAD Eyes: Normal lids and conjunctive, non-icteric sclera Respiratory: Normal respiratory effort. Cardiovascular: S1/S2 normal, no murmur, no rub/gallop auscultated. RRR.     ASSESSMENT/PLAN: Continue current medication regimen for hypertension and diabetes, update vaccines as below, the patient can get work records to me regarding A1c, can space out visits to 3 months after that, otherwise will follow-up here in about 2 months.   Essential hypertension  Type 2 diabetes mellitus without complication, with long-term current use of insulin ([place])  Need for tetanus booster  Need for prophylactic vaccination against Streptococcus pneumoniae (pneumococcus)  Need for prophylactic vaccination and inoculation against influenza     No Follow-up on file.

## 2015-06-03 NOTE — Addendum Note (Signed)
Addended by: Doree Albee on: 06/03/2015 08:40 AM   Modules accepted: Orders

## 2015-06-25 NOTE — Addendum Note (Signed)
Addended by: Doree Albee on: 06/25/2015 10:21 AM   Modules accepted: Orders

## 2015-07-02 ENCOUNTER — Telehealth: Payer: Self-pay | Admitting: Osteopathic Medicine

## 2015-07-02 NOTE — Telephone Encounter (Signed)
Please call patient, I received some papers from his insurance company, based on review of insurance claims they are concerned that he may not eat taking his metformin and his lisinopril, he should be taking lisinopril 40 mg per day for hypertension and metformin 1000 mg twice per day for diabetes. Per my notes, he should have an appointment around the beginning of January to follow-up, please make sure he has something scheduled, thanks

## 2015-07-03 NOTE — Telephone Encounter (Signed)
Left detailed message on patient vm with instructions as noted below.Jesse Calhoun,CMA  

## 2015-07-21 ENCOUNTER — Ambulatory Visit (INDEPENDENT_AMBULATORY_CARE_PROVIDER_SITE_OTHER): Payer: Managed Care, Other (non HMO) | Admitting: Osteopathic Medicine

## 2015-07-21 ENCOUNTER — Encounter: Payer: Self-pay | Admitting: Osteopathic Medicine

## 2015-07-21 VITALS — BP 126/74 | HR 87 | Ht 68.0 in | Wt 204.0 lb

## 2015-07-21 DIAGNOSIS — E119 Type 2 diabetes mellitus without complications: Secondary | ICD-10-CM | POA: Diagnosis not present

## 2015-07-21 DIAGNOSIS — I10 Essential (primary) hypertension: Secondary | ICD-10-CM | POA: Diagnosis not present

## 2015-07-21 DIAGNOSIS — E1165 Type 2 diabetes mellitus with hyperglycemia: Secondary | ICD-10-CM

## 2015-07-21 DIAGNOSIS — E785 Hyperlipidemia, unspecified: Secondary | ICD-10-CM | POA: Diagnosis not present

## 2015-07-21 DIAGNOSIS — R195 Other fecal abnormalities: Secondary | ICD-10-CM

## 2015-07-21 DIAGNOSIS — R110 Nausea: Secondary | ICD-10-CM

## 2015-07-21 DIAGNOSIS — Z794 Long term (current) use of insulin: Secondary | ICD-10-CM

## 2015-07-21 LAB — POCT GLYCOSYLATED HEMOGLOBIN (HGB A1C): HEMOGLOBIN A1C: 6.6

## 2015-07-21 MED ORDER — ATORVASTATIN CALCIUM 40 MG PO TABS: 40.0000 mg | ORAL_TABLET | Freq: Every day | ORAL | Status: DC

## 2015-07-21 NOTE — Progress Notes (Signed)
HPI: Jesse Calhoun is a 60 y.o. male who presents to [name] today for chief complaint of:  Chief Complaint  Patient presents with  . Follow-up    Diabetes and blood pressure     . Assoc signs/symptoms: metallic taste in mouth, feeling full, BM either really loose or occasionally constipation. Has been going on since starting the Metformin.   DIABETES SCREENING/PREVENTIVE CARE: A1C past 3-6 mos: Yes  controlled? Yes  BP goal <140/90: Yes  LDL goal <70: No  Eye exam annually: upcoming appointment this month, importance discussed with patient Foot exam: Yes  Microalbuminuria:n/a on ACE Metformin: Yes  ACE/ARB: Yes  Antiplatelet if ASCVD Risk >10%: No  Statin: Yes     Past medical, social and family history reviewed: Past Medical History  Diagnosis Date  . Diabetes mellitus without complication ([place])   . Hypertension    Past Surgical History  Procedure Laterality Date  . Knee surgery      age 23   Social History  Substance Use Topics  . Smoking status: Heavy Tobacco Smoker -- 5.00 packs/day for 0 years    Types: Cigars  . Smokeless tobacco: Not on file  . Alcohol Use: No   Family History  Problem Relation Age of Onset  . Diabetes Mother     Current Outpatient Prescriptions  Medication Sig Dispense Refill  . atorvastatin (LIPITOR) 40 MG tablet Take 1 tablet (40 mg total) by mouth daily. 30 tablet 1  . Insulin Glargine (LANTUS SOLOSTAR) 100 UNIT/ML Solostar Pen Inject 45 Units into the skin daily. Increase by 3 units every 3 days until fasting glucose is less than 130, then stay at that dose Insulin 15 mL 11  . lisinopril (PRINIVIL,ZESTRIL) 40 MG tablet Take 1 tablet (40 mg total) by mouth daily. 30 tablet 3  . metFORMIN (GLUCOPHAGE) 1000 MG tablet Take 1 tablet (1,000 mg total) by mouth 2 (two) times daily with a meal. (Can start at 1/2 tab daily and increase as tolerated) 180 tablet 3   No current facility-administered  medications for this visit.   No Known Allergies    Review of Systems: CONSTITUTIONAL:  No  fever, no chills, No  unintentional weight changes HEAD/EYES/EARS/NOSE/THROAT: No  headache, no vision change, no hearing change, No  sore throat, No  sinus pressure CARDIAC: No  chest pain, No  pressure, No palpitations, No  orthopnea RESPIRATORY: No  cough, No  shortness of breath/wheeze GASTROINTESTINAL: (+) occasional nausea, No  vomiting, No  abdominal pain, No  blood in stool, (+) loose stool/diarrhea, (+) occasional constipation  ENDOCRINE: No polyuria/polydipsia/polyphagia, No  heat/cold intolerance  NEUROLOGIC: No  weakness, No  dizziness, No  slurred speech, no numbness/tingling, (+) fatigue   Exam:  BP 126/74 mmHg  Pulse 87  Ht 5\' 8"  (1.727 m)  Wt 204 lb (92.534 kg)  BMI 31.03 kg/m2 Constitutional: VS see above. General Appearance: alert, well-developed, well-nourished, NAD Eyes: Normal lids and conjunctive, non-icteric sclera, Respiratory: Normal respiratory effort. no wheeze, no rhonchi, no rales Cardiovascular: S1/S2 normal, no murmur, no rub/gallop auscultated. RRR.  Skin: warm, dry, intact. No rash/ulcer. No concerning nevi or subq nodules on limited exam.    Results for orders placed or performed in visit on 07/21/15 (from the past 24 hour(s))  POCT HgB A1C     Status: None   Collection Time: 07/21/15  8:32 AM  Result Value Ref Range   Hemoglobin A1C 6.6      ASSESSMENT/PLAN: D/C  metformin due to concern for persistent GI side effects, if these SE resolve, will consider stay off Metformin vs go to XR formulation. Pt advised titrate up on Insulin 1 Unit if FBG >120-130 x3 days, titration instructions same as when Rx was initiated, pt verbalizes understanding. Call me in 2 - 3 weeks re: GI symptoms, if these persist off metformin will need further w/u.   Poorly controlled type 2 diabetes mellitus ([place]) - Plan: POCT HgB A1C, atorvastatin (LIPITOR) 40 MG tablet  Type 2  diabetes mellitus without complication, with long-term current use of insulin ([facility])  Hyperlipidemia - Plan: atorvastatin (LIPITOR) 40 MG tablet  Essential hypertension  Nausea  Loose stools   Return in about 3 months (around 10/19/2015) for DM2 FOLLOWUP.

## 2015-09-17 LAB — HEMOGLOBIN A1C: HEMOGLOBIN A1C: 6.7

## 2015-09-18 ENCOUNTER — Other Ambulatory Visit: Payer: Self-pay | Admitting: Osteopathic Medicine

## 2015-10-16 ENCOUNTER — Encounter: Payer: Self-pay | Admitting: Osteopathic Medicine

## 2015-10-16 ENCOUNTER — Ambulatory Visit (INDEPENDENT_AMBULATORY_CARE_PROVIDER_SITE_OTHER): Payer: Managed Care, Other (non HMO) | Admitting: Osteopathic Medicine

## 2015-10-16 VITALS — BP 136/82 | HR 88 | Wt 210.0 lb

## 2015-10-16 DIAGNOSIS — Z794 Long term (current) use of insulin: Secondary | ICD-10-CM

## 2015-10-16 DIAGNOSIS — T383X5A Adverse effect of insulin and oral hypoglycemic [antidiabetic] drugs, initial encounter: Secondary | ICD-10-CM

## 2015-10-16 DIAGNOSIS — Z716 Tobacco abuse counseling: Secondary | ICD-10-CM | POA: Diagnosis not present

## 2015-10-16 DIAGNOSIS — E119 Type 2 diabetes mellitus without complications: Secondary | ICD-10-CM

## 2015-10-16 MED ORDER — INSULIN GLARGINE 100 UNIT/ML SOLOSTAR PEN: 40.0000 [IU] | PEN_INJECTOR | SUBCUTANEOUS | Status: DC

## 2015-10-16 NOTE — Progress Notes (Signed)
HPI: Jesse Calhoun is a 60 y.o. male who presents to [facility] today for chief complaint of:  Chief Complaint  Patient presents with  . Diabetes    f/u    GI symptoms better after stopping Metformin.   DIABETES SCREENING/PREVENTIVE CARE: A1C past 3-6 mos: Yes  controlled? Yes  BP goal <140/90: Yes  LDL goal <70: No BUT CLOSE! Eye exam annually: has had this few months ago, normal per patient, no records available Foot exam: Yes  Microalbuminuria: n/a on ACE Metformin: No  ACE/ARB: Yes  Antiplatelet if ASCVD Risk >10%: No  Statin: Yes     Past medical, social and family history reviewed: Past Medical History  Diagnosis Date  . Diabetes mellitus without complication ([place])   . Hypertension    Past Surgical History  Procedure Laterality Date  . Knee surgery      age 12   Social History  Substance Use Topics  . Smoking status: Heavy Tobacco Smoker -- 5.00 packs/day for 0 years    Types: Cigars  . Smokeless tobacco: Not on file  . Alcohol Use: No   Family History  Problem Relation Age of Onset  . Diabetes Mother     Current Outpatient Prescriptions  Medication Sig Dispense Refill  . atorvastatin (LIPITOR) 40 MG tablet Take 1 tablet (40 mg total) by mouth daily. 30 tablet 6  . Insulin Glargine (LANTUS SOLOSTAR) 100 UNIT/ML Solostar Pen Inject 45 Units into the skin daily. Increase by 3 units every 3 days until fasting glucose is less than 130, then stay at that dose Insulin (Patient taking differently: Inject 40 Units into the skin daily. ) 15 mL 11  . lisinopril (PRINIVIL,ZESTRIL) 40 MG tablet TAKE 1 TABLET(40 MG TOTAL) BY MOUTH DAILY 30 tablet 0   No current facility-administered medications for this visit.   No Known Allergies    Review of Systems: CONSTITUTIONAL:  No  fever, no chills, No  unintentional weight changes HEAD/EYES/EARS/NOSE/THROAT: No  headache, no vision change, no hearing change, No  sore throat, No  sinus  pressure CARDIAC: No  chest pain, No  pressure, No palpitations, No  orthopnea RESPIRATORY: No  cough, No  shortness of breath/wheeze ENDOCRINE: No polyuria/polydipsia/polyphagia, No  heat/cold intolerance, NO HYPOGLYCEMIA NEUROLOGIC: No  weakness, No  dizziness, No  slurred speech, no numbness/tingling, (+) fatigue   Exam:  BP 136/82 mmHg  Pulse 88  Wt 210 lb (95.255 kg) Constitutional: VS see above. General Appearance: alert, well-developed, well-nourished, NAD Eyes: Normal lids and conjunctive, non-icteric sclera, Respiratory: Normal respiratory effort.  Skin: warm, dry, intact. No rash/ulcer.    No results found for this or any previous visit (from the past 24 hour(s)).   ASSESSMENT/PLAN: A1C at work 6.7 on 09/17/15 pt reports fasting Glc 120s - 180s. Advised can titrate up on Lantus, consider Tyler Aas if still fluctuating, A1C here is a bit higher at 7.4. Printed info on hypoglycemia.   Diabetes mellitus type 2, insulin dependent ([place]) - Plan: Insulin Glargine (LANTUS SOLOSTAR) 100 UNIT/ML Solostar Pen  Metformin adverse reaction, initial encounter - GI side effcts resolved off Metformin, not interested in switching to XR formulation at this time  Tobacco abuse counseling - Plans to quit when he retires   Return in about 3 months (around 01/15/2016) for [place].

## 2015-10-16 NOTE — Patient Instructions (Signed)

## 2015-10-17 ENCOUNTER — Ambulatory Visit: Payer: Managed Care, Other (non HMO) | Admitting: Osteopathic Medicine

## 2015-10-22 ENCOUNTER — Other Ambulatory Visit: Payer: Self-pay | Admitting: Osteopathic Medicine

## 2015-10-31 ENCOUNTER — Encounter: Payer: Self-pay | Admitting: Osteopathic Medicine

## 2015-11-13 LAB — BLOOD PRESSURE CHECK BP00

## 2015-11-13 LAB — HEMOGLOBIN A1C: HEMOGLOBIN A1C: 7.2

## 2015-11-17 ENCOUNTER — Other Ambulatory Visit: Payer: Self-pay | Admitting: Osteopathic Medicine

## 2015-11-26 DIAGNOSIS — H2510 Age-related nuclear cataract, unspecified eye: Secondary | ICD-10-CM | POA: Insufficient documentation

## 2015-12-09 ENCOUNTER — Encounter: Payer: Self-pay | Admitting: Osteopathic Medicine

## 2015-12-23 ENCOUNTER — Other Ambulatory Visit: Payer: Self-pay | Admitting: Osteopathic Medicine

## 2016-01-15 ENCOUNTER — Encounter: Payer: Self-pay | Admitting: Osteopathic Medicine

## 2016-01-15 ENCOUNTER — Ambulatory Visit (INDEPENDENT_AMBULATORY_CARE_PROVIDER_SITE_OTHER): Payer: Managed Care, Other (non HMO) | Admitting: Osteopathic Medicine

## 2016-01-15 VITALS — BP 118/77 | HR 84 | Ht 68.0 in | Wt 212.0 lb

## 2016-01-15 DIAGNOSIS — I10 Essential (primary) hypertension: Secondary | ICD-10-CM | POA: Diagnosis not present

## 2016-01-15 DIAGNOSIS — E119 Type 2 diabetes mellitus without complications: Secondary | ICD-10-CM

## 2016-01-15 DIAGNOSIS — E1165 Type 2 diabetes mellitus with hyperglycemia: Secondary | ICD-10-CM | POA: Diagnosis not present

## 2016-01-15 DIAGNOSIS — E785 Hyperlipidemia, unspecified: Secondary | ICD-10-CM | POA: Diagnosis not present

## 2016-01-15 DIAGNOSIS — Z794 Long term (current) use of insulin: Secondary | ICD-10-CM

## 2016-01-15 LAB — POCT GLYCOSYLATED HEMOGLOBIN (HGB A1C): HEMOGLOBIN A1C: 6.9

## 2016-01-15 MED ORDER — LISINOPRIL 40 MG PO TABS: 40.0000 mg | ORAL_TABLET | Freq: Every day | ORAL | Status: DC

## 2016-01-15 MED ORDER — ATORVASTATIN CALCIUM 40 MG PO TABS: 40.0000 mg | ORAL_TABLET | Freq: Every day | ORAL | Status: DC

## 2016-01-15 NOTE — Progress Notes (Signed)
HPI: Jesse Calhoun is a 60 y.o. Not Hispanic or Latino male  who presents to [facility] today, 01/15/2016,  for chief complaint of:  Chief Complaint  Patient presents with  . Follow-up    DIABETES     DIABETES SCREENING/PREVENTIVE CARE:  updated 01/15/2016  A1C past 3-6 mos: Yes controlled? No: 7.2% on 11/13/2015 BP goal <140/90: Yes  LDL goal <70: No  Eye exam annually: has had this few months ago, normal per patient, no records available Foot exam: 07/21/2015  Microalbuminuria: n/a on ACE Metformin: No - unable to tolerate ACE/ARB: Yes  Antiplatelet if ASCVD Risk >10%: No  Statin: Yes  03/2016 will be getting A1C checked through employer again  Taking Lantus 40 Units daily Fasting Glc 110-140 Had some difficulty with insurance getting pen enedles - will look into with insurance  CATARACTS - recovering well from surgery  HTN - controlled, no CP/SOB  HLD - asks if he really needs to be on statin, not having any problems with the medicine    Past medical, surgical, social and family history reviewed: Past Medical History  Diagnosis Date  . Diabetes mellitus without complication ([place])   . Hypertension    Past Surgical History  Procedure Laterality Date  . Knee surgery      age 32   Social History  Substance Use Topics  . Smoking status: Heavy Tobacco Smoker -- 5.00 packs/day for 0 years    Types: Cigars  . Smokeless tobacco: Not on file  . Alcohol Use: No   Family History  Problem Relation Age of Onset  . Diabetes Mother      Current medication list and allergy/intolerance information reviewed:   Current Outpatient Prescriptions  Medication Sig Dispense Refill  . atorvastatin (LIPITOR) 40 MG tablet Take 1 tablet (40 mg total) by mouth daily. 30 tablet 6  . Insulin Glargine (LANTUS SOLOSTAR) 100 UNIT/ML Solostar Pen Inject 40 Units into the skin daily. 15 mL 11  . lisinopril (PRINIVIL,ZESTRIL) 40 MG tablet TAKE 1  TABLET(40 MG TOTAL) BY MOUTH DAILY 30 tablet 0   No current facility-administered medications for this visit.   No Known Allergies    Review of Systems:  Constitutional:  No  fever, no chills, No recent illness, No unintentional weight changes. No significant fatigue.   HEENT: No  headache, no vision change  Cardiac: No  chest pain, No  pressure  Respiratory:  No  shortness of breath.    Exam:  BP 118/77 mmHg  Pulse 84  Ht 5\' 8"  (1.727 m)  Wt 212 lb (96.163 kg)  BMI 32.24 kg/m2  Constitutional: VS see above. General Appearance: alert, well-developed, well-nourished, NAD  Eyes: Normal lids and conjunctive, non-icteric sclera  Ears, Nose, Mouth, Throat: MMM, Normal external inspection ears/nares/mouth/lips/gums.   Neck: No masses, trachea midline. Respiratory: Normal respiratory effort. no wheeze, no rhonchi, no rales  Cardiovascular: S1/S2 normal, no murmur, no rub/gallop auscultated. RRR. No lower extremity edema.   Psychiatric: Normal judgment/insight. Normal mood and affect. Oriented x3.    Results for orders placed or performed in visit on 01/15/16 (from the past 72 hour(s))  POCT HgB A1C     Status: None   Collection Time: 01/15/16  8:37 AM  Result Value Ref Range   Hemoglobin A1C 6.9       ASSESSMENT/PLAN:   Advised can increase Lantus to 42 or 43 units if he is consistently getting fasting Glc 140's, but A1C indicates decent control  though ideally I'd like to see it at 6.5%.  Continue statin, plan to repeat labs next visit - can come early to lab if would like to know results prior to annual wellness.   Let us know what insurance/pharmacy says about needles and if there is anything we can do to help  Plan for annual exam in 3 mos  Poorly controlled type 2 diabetes mellitus ([place]) - Plan: POCT HgB A1C, atorvastatin (LIPITOR) 40 MG tablet  Essential hypertension - Plan: lisinopril (PRINIVIL,ZESTRIL) 40 MG tablet  Hyperlipidemia - Plan: atorvastatin  (LIPITOR) 40 MG tablet    Visit summary with medication list and pertinent instructions was printed for patient to review. All questions at time of visit were answered - patient instructed to contact office with any additional concerns. ER/RTC precautions were reviewed with the patient. Follow-up plan: Return in about 3 months (around 04/16/2016), or sooner if needed, for ANNUAL LABS / PHYSICAL .

## 2016-02-04 LAB — HEMOGLOBIN A1C
HEMOGLOBIN A1C: 7.2
Hemoglobin A1C: 7.2

## 2016-02-04 LAB — BASIC METABOLIC PANEL: GLUCOSE: 135 mg/dL

## 2016-02-04 LAB — LIPID PANEL
Cholesterol: 125 mg/dL (ref 0–200)
HDL: 28 mg/dL — AB (ref 35–70)
LDL CALC: 80 mg/dL
Triglycerides: 88 mg/dL (ref 40–160)

## 2016-02-24 ENCOUNTER — Encounter: Payer: Self-pay | Admitting: Osteopathic Medicine

## 2016-02-27 ENCOUNTER — Encounter: Payer: Self-pay | Admitting: Osteopathic Medicine

## 2016-04-16 ENCOUNTER — Encounter: Payer: Self-pay | Admitting: Osteopathic Medicine

## 2016-04-16 ENCOUNTER — Ambulatory Visit (INDEPENDENT_AMBULATORY_CARE_PROVIDER_SITE_OTHER): Payer: Managed Care, Other (non HMO) | Admitting: Osteopathic Medicine

## 2016-04-16 VITALS — BP 135/61 | HR 76 | Ht 68.0 in | Wt 222.0 lb

## 2016-04-16 DIAGNOSIS — R5383 Other fatigue: Secondary | ICD-10-CM

## 2016-04-16 DIAGNOSIS — E119 Type 2 diabetes mellitus without complications: Secondary | ICD-10-CM | POA: Diagnosis not present

## 2016-04-16 DIAGNOSIS — Z794 Long term (current) use of insulin: Secondary | ICD-10-CM

## 2016-04-16 DIAGNOSIS — R252 Cramp and spasm: Secondary | ICD-10-CM | POA: Diagnosis not present

## 2016-04-16 LAB — COMPLETE METABOLIC PANEL WITH GFR
ALK PHOS: 61 U/L (ref 40–115)
ALT: 18 U/L (ref 9–46)
AST: 12 U/L (ref 10–35)
Albumin: 4 g/dL (ref 3.6–5.1)
BILIRUBIN TOTAL: 0.4 mg/dL (ref 0.2–1.2)
BUN: 21 mg/dL (ref 7–25)
CALCIUM: 9.6 mg/dL (ref 8.6–10.3)
CO2: 23 mmol/L (ref 20–31)
CREATININE: 1.19 mg/dL (ref 0.70–1.33)
Chloride: 103 mmol/L (ref 98–110)
GFR, EST AFRICAN AMERICAN: 77 mL/min (ref >=60)
GFR, EST NON AFRICAN AMERICAN: 66 mL/min (ref >=60)
Glucose, Bld: 124 mg/dL — ABNORMAL HIGH (ref 65–99)
Potassium: 5.2 mmol/L (ref 3.5–5.3)
Sodium: 139 mmol/L (ref 135–146)
TOTAL PROTEIN: 7.3 g/dL (ref 6.1–8.1)

## 2016-04-16 LAB — CBC WITH DIFFERENTIAL/PLATELET
BASOS ABS: 90 {cells}/uL (ref 0–200)
BASOS PCT: 1 %
EOS ABS: 720 {cells}/uL — AB (ref 15–500)
Eosinophils Relative: 8 %
HEMATOCRIT: 47.3 % (ref 38.5–50.0)
Hemoglobin: 16.2 g/dL (ref 13.2–17.1)
LYMPHS PCT: 16 %
Lymphs Abs: 1440 cells/uL (ref 850–3900)
MCH: 30.5 pg (ref 27.0–33.0)
MCHC: 34.2 g/dL (ref 32.0–36.0)
MCV: 88.9 fL (ref 80.0–100.0)
MONO ABS: 630 {cells}/uL (ref 200–950)
MPV: 9.9 fL (ref 7.5–12.5)
Monocytes Relative: 7 %
Neutro Abs: 6120 cells/uL (ref 1500–7800)
Neutrophils Relative %: 68 %
Platelets: 221 10*3/uL (ref 140–400)
RBC: 5.32 MIL/uL (ref 4.20–5.80)
RDW: 13.4 % (ref 11.0–15.0)
WBC: 9 10*3/uL (ref 3.8–10.8)

## 2016-04-16 LAB — TSH: TSH: 1.14 mIU/L (ref 0.40–4.50)

## 2016-04-16 MED ORDER — BASAGLAR KWIKPEN 100 UNIT/ML ~~LOC~~ SOPN: 42.0000 [IU] | PEN_INJECTOR | Freq: Every day | SUBCUTANEOUS | 6 refills | Status: DC

## 2016-04-16 NOTE — Patient Instructions (Signed)
Testosterone today, if low, will need additional blood work to confirm low testosterone and will bring back for a separate visit to discuss management. We'll get other labs to workup fatigue as well.  You should get Cologuard testing kit in the mail, please let us know if you don't receive this.  As long as we are getting good A1c from your employer over the next few months, we can space out our visit here to every 6 months unless you are having a problem, in which case come see [place] sooner.  Anything else he needs, just let us know!

## 2016-04-16 NOTE — Progress Notes (Signed)
HPI: Jesse Calhoun is a 60 y.o. male  who presents to [facility] today, 04/16/16,  for chief complaint of:  Chief Complaint  Patient presents with  . Annual Exam    Here for annual preventive care visit and brief follow-up on diabetes. See below for review of preventive care  Overall doing well, no major changes. Patient reports significant fatigue, stress at work. Patient would like testosterone testing. Also reports some intermittent leg cramping. No weakness or falls. No lower back pain/injury.  Insurance is no longer covering current formulation of insulin, he brings alternative list with him for new prescription.   Past medical, surgical, social and family history reviewed: Past Medical History:  Diagnosis Date  . Cataract    11/2015 and 12/2015  . Diabetes mellitus without complication ([place])   . Hypertension    Past Surgical History:  Procedure Laterality Date  . KNEE SURGERY     age 33   Social History  Substance Use Topics  . Smoking status: Heavy Tobacco Smoker    Packs/day: 5.00    Years: 0.00    Types: Cigars  . Smokeless tobacco: Not on file  . Alcohol use No   Family History  Problem Relation Age of Onset  . Diabetes Mother      Current medication list and allergy/intolerance information reviewed:   Current Outpatient Prescriptions  Medication Sig Dispense Refill  . atorvastatin (LIPITOR) 40 MG tablet Take 1 tablet (40 mg total) by mouth daily. 90 tablet 3  . Insulin Glargine (LANTUS SOLOSTAR) 100 UNIT/ML Solostar Pen Inject 40 Units into the skin daily. 15 mL 11  . lisinopril (PRINIVIL,ZESTRIL) 40 MG tablet Take 1 tablet (40 mg total) by mouth daily. 90 tablet 3   No current facility-administered medications for this visit.    No Known Allergies    Review of Systems:  Constitutional:  No  fever, no chills, No recent illness, No unintentional weight changes. +significant fatigue.   HEENT: No  headache, no vision  change  Cardiac: No  chest pain, No  pressure  Respiratory:  No  shortness of breath.   Gastrointestinal: No  abdominal pain, No  nausea, No  vomiting,  No  blood in stool, No  diarrhea, No  constipation    Musculoskeletal: No new myalgia/arthralgia  Genitourinary: No  incontinence, No  abnormal genital bleeding, No abnormal genital discharge  Skin: No  Rash, No other wounds/concerning lesions  Hem/Onc: No  easy bruising/bleeding  Endocrine: No cold intolerance,  No heat intolerance. No polyuria/polydipsia/polyphagia   Neurologic: No  weakness, No  dizziness  Psychiatric: No  concerns with depression, No  concerns with anxiety, No sleep problems, No mood problems  Exam:  BP 135/61   Pulse 76   Ht 5\' 8"  (1.727 m)   Wt 222 lb (100.7 kg)   BMI 33.75 kg/m   Constitutional: VS see above. General Appearance: alert, well-developed, well-nourished, NAD  Eyes: Normal lids and conjunctive, non-icteric sclera  Ears, Nose, Mouth, Throat: MMM, Normal external inspection ears/nares/mouth/lips/gums.   Neck: No masses, trachea midline. No thyroid enlargement. No tenderness/mass appreciated. No lymphadenopathy  Respiratory: Normal respiratory effort. no wheeze, no rhonchi, no rales  Cardiovascular: S1/S2 normal, no murmur, no rub/gallop auscultated. RRR. No lower extremity edema.   Musculoskeletal: Gait normal. No clubbing/cyanosis of digits.   Neurological: Normal balance/coordination. No tremor.   Skin: warm, dry, intact.   Psychiatric: Normal judgment/insight. Normal mood and affect. Oriented x3.    Recent  Results (from the past 2160 hour(s))  Hemoglobin A1c     Status: None   Collection Time: 02/04/16 12:00 AM  Result Value Ref Range   Hemoglobin A1C 7.2   Basic metabolic panel     Status: None   Collection Time: 02/04/16 12:00 AM  Result Value Ref Range   Glucose 135 mg/dL  Lipid panel     Status: Abnormal   Collection Time: 02/04/16 12:00 AM  Result Value Ref Range    Triglycerides 88 40 - 160 mg/dL   Cholesterol 125 0 - 200 mg/dL   HDL 28 (A) 35 - 70 mg/dL   LDL Cholesterol 80 mg/dL  Hemoglobin A1c     Status: None   Collection Time: 02/04/16 12:00 AM  Result Value Ref Range   Hemoglobin A1C 7.2      ASSESSMENT/PLAN:   Type 2 diabetes mellitus without complication, with long-term current use of insulin ([place]) - Plan: Insulin Glargine (BASAGLAR KWIKPEN) 100 UNIT/ML SOPN, CBC with Differential/Platelet, COMPLETE METABOLIC PANEL WITH GFR  Leg cramps - Plan: Vitamin B12  Fatigue, unspecified type - Plan: CBC with Differential/Platelet, COMPLETE METABOLIC PANEL WITH GFR, Testosterone Total,Free,Bio, Males, TSH   DIABETES SCREENING/PREVENTIVE CARE:  updated 04/16/16  A1C past 3-6 mos: Yes controlled? 7.2% on 11/13/2015, 6.9% on 01/15/2016, 7.2% on 02/04/2016 BP goal <140/90: Yes  LDL goal <70: No but close at 80 as of 02/04/16 Eye exam annually: has had this done this year, normal per patient, no records available Foot exam: 07/21/2015  Microalbuminuria: n/a on ACE Metformin: No - unable to tolerate ACE/ARB: Yes  Antiplatelet if ASCVD Risk >10%: No  Statin: Yes   MALE PREVENTIVE CARE updated 04/16/16  ANNUAL SCREENING/COUNSELING  Any changes to health in the past year? no  Tobacco - yes: cigars   Alcohol - none  Diet/Exercise - Healthy habits discussed to decrease CV risk and promote overall health. Patient does not have dietary restrictions.   Depression - PQH2 Negative  Feel safe at home? - yes  HTN SCREENING - SEE VITALS  SEXUAL/REPRODUCTIVE HEALTH  STI testing needed/desired today? - no  Any concerns with testosterone/libido? - yes  INFECTIOUS DISEASE SCREENING  HIV - does not need  GC/CT - does not need  HepC - does not need  TB - does not need  CANCER SCREENING  Lung - does not need  Colon - needs - opts for Cologuard  Prostate - does not need  OTHER DISEASE SCREENING  Lipid - needs  DM2 -  needs  Osteoporosis - does not need  ADULT VACCINATION  Influenza - does not need today, got it at work, annual vaccine recommended  Td - already has  Zoster - does not need  Pneumonia - already has        Visit summary with medication list and pertinent instructions was printed for patient to review. All questions at time of visit were answered - patient instructed to contact office with any additional concerns. ER/RTC precautions were reviewed with the patient. Follow-up plan: Return in about 6 months (around 10/15/2016) for DIABETES FOLLOWUP or sooner if needed or if we aren't gettiing A1C records.

## 2016-04-19 LAB — TESTOSTERONE TOTAL,FREE,BIO, MALES
ALBUMIN: 4 g/dL (ref 3.6–5.1)
SEX HORMONE BINDING: 44 nmol/L (ref 22–77)
TESTOSTERONE: 353 ng/dL (ref 250–827)
Testosterone, Bioavailable: 67.3 ng/dL — ABNORMAL LOW (ref 110.0–575.0)
Testosterone, Free: 36.6 pg/mL — ABNORMAL LOW (ref 46.0–224.0)

## 2016-04-22 ENCOUNTER — Telehealth: Payer: Self-pay | Admitting: *Deleted

## 2016-04-22 NOTE — Telephone Encounter (Addendum)
Initiated a PA through covermymeds [id number] - PA Case ID: [id number]  This has been denied. Rejection notice says he has to have tried and failed Lantus or toujeo and levemir. Patient has tried and failed Lantus solostar

## 2016-04-29 NOTE — Telephone Encounter (Signed)
Okay, patient showed me a form stating that Jesse Calhoun was one of the only insulins covered. Can we call him to clarify? As far as I'm concerned he can stay on the insulin he currently is taking.

## 2016-04-30 NOTE — Telephone Encounter (Signed)
Perhaps the letter was informing him of changes for new plan year..called the patient to clarify and he was not sure.He assumed the changes were immediate. He picked up the previous medication he was on(lantus) and has a month supply of this now that was covered by insurance. He will call his insurance to get clarification and let us know next week

## 2016-05-03 NOTE — Telephone Encounter (Signed)
Change from Christella Scheuermann does not start until Jan 1 so patient will continue on Lantus until then. At that point the [place] will probably be covered

## 2016-05-04 NOTE — Telephone Encounter (Signed)
Noted  

## 2016-05-05 ENCOUNTER — Encounter: Payer: Self-pay | Admitting: Osteopathic Medicine

## 2016-05-06 ENCOUNTER — Other Ambulatory Visit: Payer: Self-pay | Admitting: Osteopathic Medicine

## 2016-05-28 ENCOUNTER — Other Ambulatory Visit: Payer: Self-pay | Admitting: Osteopathic Medicine

## 2016-05-28 DIAGNOSIS — E1165 Type 2 diabetes mellitus with hyperglycemia: Secondary | ICD-10-CM

## 2016-06-02 ENCOUNTER — Other Ambulatory Visit: Payer: Self-pay

## 2016-06-02 DIAGNOSIS — E1165 Type 2 diabetes mellitus with hyperglycemia: Secondary | ICD-10-CM

## 2016-06-02 DIAGNOSIS — E119 Type 2 diabetes mellitus without complications: Secondary | ICD-10-CM

## 2016-06-02 DIAGNOSIS — Z794 Long term (current) use of insulin: Secondary | ICD-10-CM

## 2016-06-02 MED ORDER — INSULIN GLARGINE 100 UNIT/ML SOLOSTAR PEN: PEN_INJECTOR | SUBCUTANEOUS | 0 refills | Status: DC

## 2016-06-02 MED ORDER — BASAGLAR KWIKPEN 100 UNIT/ML ~~LOC~~ SOPN: 42.0000 [IU] | PEN_INJECTOR | Freq: Every day | SUBCUTANEOUS | 6 refills | Status: DC

## 2016-06-02 NOTE — Telephone Encounter (Signed)
Patient called requested a refill for Lantus and Insulin. Both refills were sent to [facility] in [facility]. Antawan Mchugh,CMA

## 2016-07-01 ENCOUNTER — Other Ambulatory Visit: Payer: Self-pay | Admitting: Osteopathic Medicine

## 2016-07-01 DIAGNOSIS — E1165 Type 2 diabetes mellitus with hyperglycemia: Secondary | ICD-10-CM

## 2016-08-11 ENCOUNTER — Telehealth: Payer: Self-pay

## 2016-08-11 DIAGNOSIS — E1165 Type 2 diabetes mellitus with hyperglycemia: Secondary | ICD-10-CM

## 2016-08-11 MED ORDER — INSULIN GLARGINE 100 UNIT/ML SOLOSTAR PEN: PEN_INJECTOR | SUBCUTANEOUS | 0 refills | Status: DC

## 2016-08-11 MED ORDER — ACCU-CHEK SOFTCLIX LANCETS MISC: 0 refills | Status: DC

## 2016-08-11 NOTE — Telephone Encounter (Signed)
PATIENT REQUEST REFILL FOR LANTUS AND NEEDLE SYRINGES. PATIENT WAS ADVISED THAT A FOLLOW UP APPOINTMENT IS NEEDED FOR FURTHER REFILLS

## 2016-08-17 LAB — HEMOGLOBIN A1C: HEMOGLOBIN A1C: 7.1

## 2016-09-13 ENCOUNTER — Encounter: Payer: Self-pay | Admitting: Osteopathic Medicine

## 2016-09-15 ENCOUNTER — Other Ambulatory Visit: Payer: Self-pay | Admitting: Osteopathic Medicine

## 2016-10-15 ENCOUNTER — Ambulatory Visit (INDEPENDENT_AMBULATORY_CARE_PROVIDER_SITE_OTHER): Payer: Managed Care, Other (non HMO) | Admitting: Osteopathic Medicine

## 2016-10-15 ENCOUNTER — Encounter: Payer: Self-pay | Admitting: Osteopathic Medicine

## 2016-10-15 ENCOUNTER — Ambulatory Visit (INDEPENDENT_AMBULATORY_CARE_PROVIDER_SITE_OTHER): Payer: Managed Care, Other (non HMO)

## 2016-10-15 VITALS — BP 118/72 | HR 76 | Wt 222.0 lb

## 2016-10-15 DIAGNOSIS — Z1211 Encounter for screening for malignant neoplasm of colon: Secondary | ICD-10-CM | POA: Diagnosis not present

## 2016-10-15 DIAGNOSIS — M25511 Pain in right shoulder: Secondary | ICD-10-CM

## 2016-10-15 DIAGNOSIS — G8929 Other chronic pain: Secondary | ICD-10-CM

## 2016-10-15 DIAGNOSIS — Z794 Long term (current) use of insulin: Secondary | ICD-10-CM | POA: Diagnosis not present

## 2016-10-15 DIAGNOSIS — E119 Type 2 diabetes mellitus without complications: Secondary | ICD-10-CM

## 2016-10-15 DIAGNOSIS — I10 Essential (primary) hypertension: Secondary | ICD-10-CM

## 2016-10-15 MED ORDER — NAPROXEN 500 MG PO TABS: 500.0000 mg | ORAL_TABLET | Freq: Two times a day (BID) | ORAL | 2 refills | Status: DC

## 2016-10-15 MED ORDER — INSULIN GLARGINE 100 UNIT/ML SOLOSTAR PEN: 42.0000 [IU] | PEN_INJECTOR | Freq: Every day | SUBCUTANEOUS | 1 refills | Status: DC

## 2016-10-15 NOTE — Patient Instructions (Signed)
Plan: 1. Have your pharmacy confirm which insulin you should be taking 2. They should let you get an early refill, insurance may or may not fully cover it is the only issue I can foresee - see printed prescription 3. Plan for routine labs (cholesterol, liver/kidney function) in the fall with your annual physical, come see me sooner if needed 4. Shoulder Xray today and anti-inflammatories as needed. Would recommend follow-up with sports medicine Dr. Georgina Snell or Dr. Dianah Field (Dr. Darene Lamer) if shoulder isn't better

## 2016-10-15 NOTE — Progress Notes (Signed)
HPI: Jesse Calhoun is a 61 y.o. male  who presents to [facility] today, 10/15/16,  for chief complaint of:  Chief Complaint  Patient presents with  . Diabetes  . Hypertension     DIABETES SCREENING/PREVENTIVE CARE: A1C past 3-6 mos: Yes  controlled? Yes: 7.1% BP goal <140/90: Yes  LDL goal <70: Yes  Eye exam annually: Yes but no records recently, importance discussed with patient Foot exam: 10/15/16 normal Microalbuminuria:n/a Metformin: intolerant ACE/ARB: Yes  Antiplatelet if ASCVD Risk >10%: Yes  Statin: Yes  Pneumovax: Yes   HTN: No chest pain, pressure, shortness of breath. No home blood pressures to report. Well-controlled on current medication  Shoulder pain . Context: possibly injured walking into truck mirror months ago . Location: anterior R shoulder . Quality: sore, occasional catching feeling, no weakness, range of motion - limited abduction . Duration: few months     Past medical history, surgical history, social history and family history reviewed.  Patient Active Problem List   Diagnosis Date Noted  . Type 2 diabetes mellitus without complication, with long-term current use of insulin ([place]) 07/21/2015  . Hyperlipidemia 03/26/2015  . Hoarseness of voice 03/19/2015  . Essential hypertension 03/19/2015    Current medication list and allergy/intolerance information reviewed.   Current Outpatient Prescriptions on File Prior to Visit  Medication Sig Dispense Refill  . ACCU-CHEK AVIVA PLUS test strip USE TO TEST TWICE A DAY AS DIRECTED 100 each 6  . ACCU-CHEK SOFTCLIX LANCETS lancets TEST TWICE A DAY AS DIRECTED 100 each 0  . atorvastatin (LIPITOR) 40 MG tablet Take 1 tablet (40 mg total) by mouth daily. 90 tablet 3  . lisinopril (PRINIVIL,ZESTRIL) 40 MG tablet Take 1 tablet (40 mg total) by mouth daily. 90 tablet 3   No current facility-administered medications on file prior to visit.    No Known Allergies    Review of  Systems:  Constitutional: No recent illness  HEENT: No  headache, no vision change  Cardiac: No  chest pain, No  pressure, No palpitations  Respiratory:  No  shortness of breath. No  Cough  Gastrointestinal: No  abdominal pain  Neurologic: No  weakness, No  Dizziness    Exam:  BP 118/72   Pulse 76   Wt 222 lb (100.7 kg)   BMI 33.75 kg/m   Constitutional: VS see above. General Appearance: alert, well-developed, well-nourished, NAD  Eyes: Normal lids and conjunctive, non-icteric sclera  Ears, Nose, Mouth, Throat: MMM, Normal external inspection ears/nares/mouth/lips/gums.  Neck: No masses, trachea midline.   Respiratory: Normal respiratory effort.  Musculoskeletal: Gait normal. Symmetric and independent movement of all extremities  Neurological: Normal balance/coordination. No tremor.  Skin: warm, dry, intact.   Psychiatric: Normal judgment/insight. Normal mood and affect. Oriented x3.    Recent Results (from the past 2160 hour(s))  Hemoglobin A1c     Status: None   Collection Time: 08/17/16 12:00 AM  Result Value Ref Range   Hemoglobin A1C 7.1     ASSESSMENT/PLAN:   Type 2 diabetes mellitus without complication, with long-term current use of insulin ([facility]) - Plan: Insulin Glargine (LANTUS SOLOSTAR) 100 UNIT/ML Solostar Pen  Essential hypertension  Colon cancer screening - Plan: Cologuard  Chronic right shoulder pain - Plan: naproxen (NAPROSYN) 500 MG tablet, DG Shoulder Right    Patient Instructions  Plan: 1. Have your pharmacy confirm which insulin you should be taking 2. They should let you get an early refill, insurance may or may not  fully cover it is the only issue I can foresee - see printed prescription 3. Plan for routine labs (cholesterol, liver/kidney function) in the fall with your annual physical, come see me sooner if needed 4. Shoulder Xray today and anti-inflammatories as needed. Would recommend follow-up with sports medicine Dr. Georgina Snell or  Dr. Dianah Field (Dr. Darene Lamer) if shoulder isn't better     Follow-up plan: Return in about 6 months (around 04/16/2017) for [facility] and routine labs .  Visit summary with medication list and pertinent instructions was printed for patient to review, alert [place] if any changes needed. All questions at time of visit were answered - patient instructed to contact office with any additional concerns. ER/RTC precautions were reviewed with the patient and understanding verbalized.

## 2016-11-16 LAB — HEMOGLOBIN A1C: HEMOGLOBIN A1C: 7.8

## 2016-12-08 ENCOUNTER — Other Ambulatory Visit: Payer: Self-pay | Admitting: Osteopathic Medicine

## 2017-01-22 IMAGING — CT CT NECK W/ CM
2 of 7 series · 6 of 20 positions shown, 7 images · IV contrast (omnipaque)
Comparison: None.

CLINICAL DATA: Drinking coffee with choking episode several months
ago. Subsequent loss of phonation.

EXAM:
CT NECK, CHEST WITH CONTRAST
TECHNIQUE: Multidetector CT imaging of the neck, chest, abdomen and pelvis was
performed following the standard protocol during bolus
administration of intravenous contrast.
CONTRAST:  75mL OMNIPAQUE IOHEXOL 300 MG/ML  SOLN

[Series 2: axial chest, neck · axial · 0.87mm/px · z∈[-552,-76]mm · 3 of 164 slices shown, 4 images]
[im 1/164  soft-tissue]
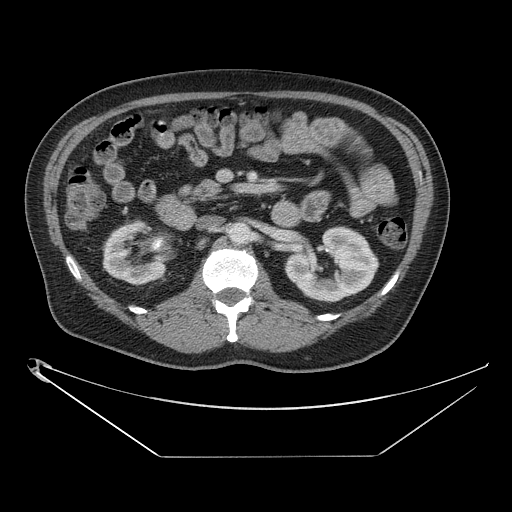
[im 1/164  bone]
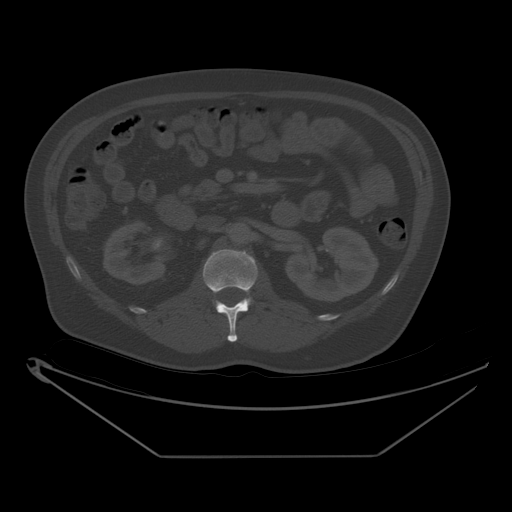
[im 82/164  bone]
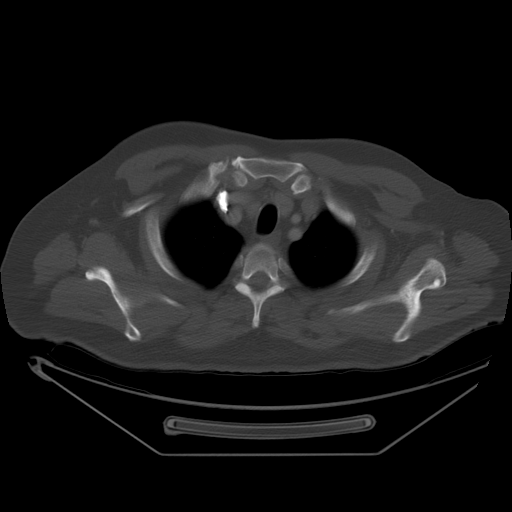
[im 164/164  bone]
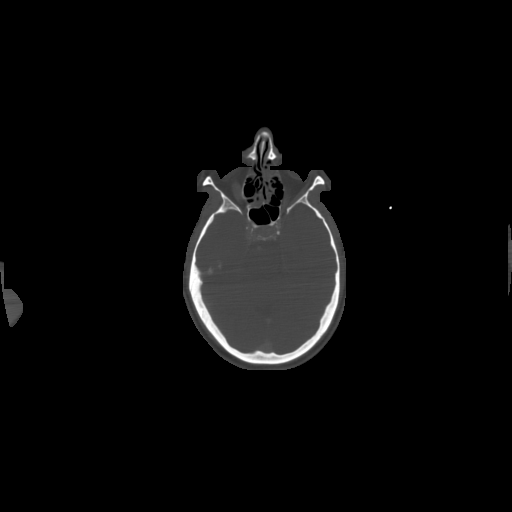

[Series 403: cor soft (person_name) neck · coronal · 0.87mm/px · 3 of 176 slices shown]
[im 67/176  bone]
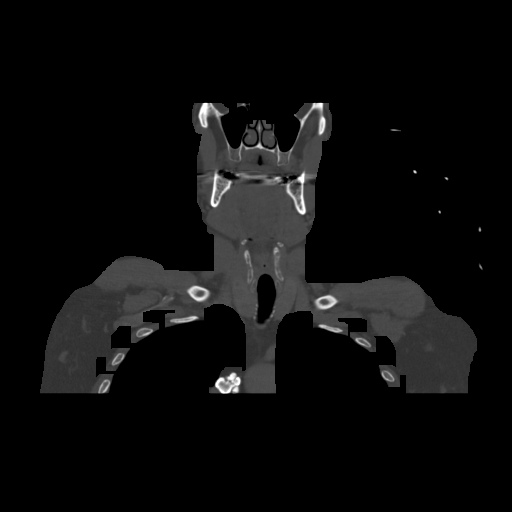
[im 81/176  bone]
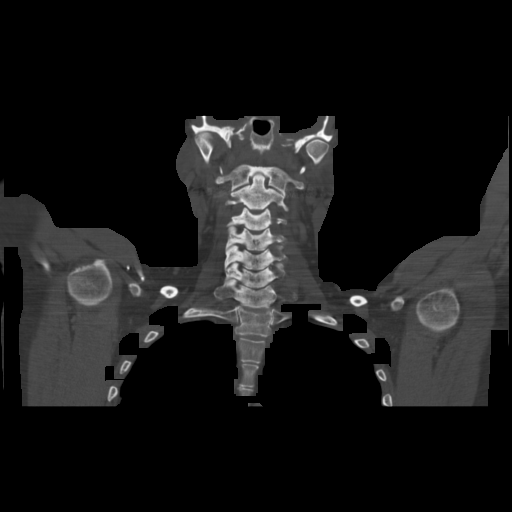
[im 96/176  bone]
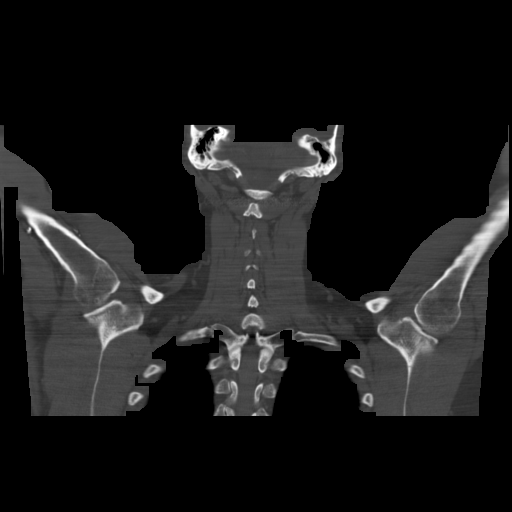

[6 of 20 positions shown; findings below may reference images not displayed]

FINDINGS: CT NECK FINDINGS

Pharynx and larynx: The vallecula it is not distended on the right
as is on the left. Piriform sinus on the right is slightly more
air-filled down on the left. Possibility of a mass in the vallecula
on the right is not excluded, though asymmetry is common. Vocal fold
on the right appears near the midline, raising the possibility of
vocal cord paralysis or paresis on the right. No definable glottic
mass lesion.

Salivary glands: Parotid and submandibular glands are normal.

Thyroid: Normal

Lymph nodes: No enlarged or low-density nodes on either side of the
neck.

Vascular: Some atherosclerotic change of the carotid bifurcation
regions bilaterally. Venous structures appear patent and normal.

Limited intracranial: Normal

Visualized orbits: Normal

Mastoids and visualized paranasal sinuses: Clear

Skeleton: Ordinary cervical degenerative changes.

CT CHEST FINDINGS

Mediastinum/Nodes: No mediastinal mass or active lymphadenopathy.
There are highly calcified lymph nodes in the low right
paratracheal,. Carinal and both hilar regions consistent with old
granulomatous infection. No lesion seen to explain compromise of the
recurrent laryngeal nerve.

Lungs/Pleura: No evidence of mass, infiltrate, collapse. No pleural
effusion. Small calcified granuloma is in the right perihilar lung
and in the left lung consistent with old granulomatous infection.

Musculoskeletal: Mild curvature the spine. Ordinary spondylosis
bridging osteophytes, possibly indicating diffuse idiopathic
skeletal hyperostosis. No fracture or likely significant finding.

Scans in the upper abdomen do not show any significant lesion. Few
tiny renal calculi.
IMPRESSION: CT neck: Findings probably associated with recurrent laryngeal nerve
pathology on the right with the right vocal fold near the midline
and slight prominence to the right piriform sinus. Collapse of the
right vallecula compared to the left is probably not significant,
but a vallecular mass could not be excluded based on this scan. I
understand that the patient has had endoscopy. No specific cause of
right recurrent laryngeal nerve pathology is identified.

CT chest: Evidence of old/ inactive granulomatous infection. No
active process in the chest or mediastinum to explain recurrent
nerve pathology on the right.

## 2017-02-03 ENCOUNTER — Other Ambulatory Visit: Payer: Self-pay | Admitting: Osteopathic Medicine

## 2017-02-03 DIAGNOSIS — I10 Essential (primary) hypertension: Secondary | ICD-10-CM

## 2017-02-03 DIAGNOSIS — E1165 Type 2 diabetes mellitus with hyperglycemia: Secondary | ICD-10-CM

## 2017-02-03 DIAGNOSIS — E785 Hyperlipidemia, unspecified: Secondary | ICD-10-CM

## 2017-02-15 LAB — HEMOGLOBIN A1C: HEMOGLOBIN A1C: 7.4

## 2017-03-16 ENCOUNTER — Encounter: Payer: Self-pay | Admitting: Osteopathic Medicine

## 2017-03-30 ENCOUNTER — Other Ambulatory Visit: Payer: Self-pay | Admitting: Osteopathic Medicine

## 2017-04-19 ENCOUNTER — Ambulatory Visit (INDEPENDENT_AMBULATORY_CARE_PROVIDER_SITE_OTHER): Payer: Managed Care, Other (non HMO) | Admitting: Osteopathic Medicine

## 2017-04-19 ENCOUNTER — Encounter: Payer: Self-pay | Admitting: Osteopathic Medicine

## 2017-04-19 VITALS — BP 168/77 | HR 70 | Ht 68.0 in | Wt 231.0 lb

## 2017-04-19 DIAGNOSIS — Z1211 Encounter for screening for malignant neoplasm of colon: Secondary | ICD-10-CM | POA: Diagnosis not present

## 2017-04-19 DIAGNOSIS — R7309 Other abnormal glucose: Secondary | ICD-10-CM | POA: Diagnosis not present

## 2017-04-19 DIAGNOSIS — R7989 Other specified abnormal findings of blood chemistry: Secondary | ICD-10-CM

## 2017-04-19 DIAGNOSIS — Z0000 Encounter for general adult medical examination without abnormal findings: Secondary | ICD-10-CM

## 2017-04-19 MED ORDER — BASAGLAR KWIKPEN 100 UNIT/ML ~~LOC~~ SOPN: 42.0000 [IU] | PEN_INJECTOR | Freq: Every day | SUBCUTANEOUS | 6 refills | Status: DC

## 2017-04-19 NOTE — Progress Notes (Signed)
HPI: Jesse Calhoun is a 61 y.o. male  who presents to [facility] today, 04/19/17,  for chief complaint of:  Chief Complaint  Patient presents with  . Annual Exam      Patient here for annual physical / wellness exam.  See preventive care reviewed as below.  Recent labs reviewed in detail with the patient.   Additional concerns today include:  Hearing diminished a bit - tougher to hear the TV, doesn't want hearing evaluation/hearing aids at this point     Past medical, surgical, social and family history reviewed: Patient Active Problem List   Diagnosis Date Noted  . Type 2 diabetes mellitus without complication, with long-term current use of insulin ([place]) 07/21/2015  . Hyperlipidemia 03/26/2015  . Hoarseness of voice 03/19/2015  . Essential hypertension 03/19/2015   Past Surgical History:  Procedure Laterality Date  . KNEE SURGERY     age 37   Social History  Substance Use Topics  . Smoking status: Heavy Tobacco Smoker    Packs/day: 5.00    Years: 0.00    Types: Cigars  . Smokeless tobacco: Never Used  . Alcohol use No   Family History  Problem Relation Age of Onset  . Diabetes Mother      Current medication list and allergy/intolerance information reviewed:   Current Outpatient Prescriptions  Medication Sig Dispense Refill  . ACCU-CHEK AVIVA PLUS test strip USE TO TEST TWICE A DAY AS DIRECTED 100 each 6  . ACCU-CHEK SOFTCLIX LANCETS lancets TEST TWICE DAILY AS DIRECTED 100 each 0  . atorvastatin (LIPITOR) 40 MG tablet TAKE 1 TABLET(40 MG) BY MOUTH DAILY 90 tablet 0  . B-D ULTRAFINE III SHORT PEN 31G X 8 MM MISC USE AS DAILY AS DIRECTED 100 each 0  . lisinopril (PRINIVIL,ZESTRIL) 40 MG tablet TAKE 1 TABLET(40 MG) BY MOUTH DAILY 90 tablet 0  . naproxen (NAPROSYN) 500 MG tablet Take 1 tablet (500 mg total) by mouth 2 (two) times daily with a meal. As needed for shoulder pain 60 tablet 2   No current facility-administered  medications for this visit.    No Known Allergies    Review of Systems:  Constitutional:  No  fever, no chills, No recent illness, No unintentional weight changes.   HEENT: No  headache, no vision change, +hearing change, No sore throat, No  sinus pressure  Cardiac: No  chest pain, No  pressure, No palpitations, No  Orthopnea  Respiratory:  No  shortness of breath. No  Cough  Gastrointestinal: No  abdominal pain, No  nausea, No  vomiting,  No  blood in stool, No  diarrhea, No  constipation   Musculoskeletal: No new myalgia/arthralgia  Genitourinary: No  incontinence, No  abnormal genital bleeding, No abnormal genital discharge  Skin: No  Rash, No other wounds/concerning lesions  Hem/Onc: No  easy bruising/bleeding, No  abnormal lymph node  Endocrine: No cold intolerance,  No heat intolerance. No polyuria/polydipsia/polyphagia   Neurologic: No  weakness, No  dizziness  Psychiatric: No  concerns with depression, No  concerns with anxiety, No sleep problems, No mood problems  Exam:  BP (!) 168/77   Pulse 70   Ht 5\' 8"  (1.727 m)   Wt 231 lb (104.8 kg)   BMI 35.12 kg/m   Constitutional: VS see above. General Appearance: alert, well-developed, well-nourished, NAD  Eyes: Normal lids and conjunctive, non-icteric sclera  Ears, Nose, Mouth, Throat: MMM, Normal external inspection ears/nares/mouth/lips/gums. TM normal bilaterally except some  likely age-related scarring. Pharynx/tonsils no erythema, no exudate. Nasal mucosa normal.   Neck: No masses, trachea midline. No thyroid enlargement. No tenderness/mass appreciated. No lymphadenopathy  Respiratory: Normal respiratory effort. no wheeze, no rhonchi, no rales  Cardiovascular: S1/S2 normal, no murmur, no rub/gallop auscultated. RRR. No lower extremity edema.   Gastrointestinal: Nontender, no masses. No hepatomegaly, no splenomegaly. No hernia appreciated. Bowel sounds normal. Rectal exam deferred.   Musculoskeletal: Gait  normal. No clubbing/cyanosis of digits.   Neurological: Normal balance/coordination. No tremor. No cranial nerve deficit on limited exam. Motor intact and symmetric.   Skin: warm, dry, intact.   Psychiatric: Normal judgment/insight. Normal mood and affect. Oriented x3.     ASSESSMENT/PLAN:   Annual physical exam - Plan: CBC, COMPLETE METABOLIC PANEL WITH GFR, Lipid panel  Colon cancer screening - Plan: Cologuard   MALE PREVENTIVE CARE  updated 04/19/17  ANNUAL SCREENING/COUNSELING  Any changes to health in the past year? no  Diet/Exercise - HEALTHY HABITS DISCUSSED TO DECREASE CV RISK History  Smoking Status  . Heavy Tobacco Smoker  . Packs/day: 5.00  . Years: 0.00  . Types: Cigars  Smokeless Tobacco  . Never Used   History  Alcohol Use: none, occasional/rare with holidays  No   Depression screen PHQ 2/9 04/19/2017  Decreased Interest 0  Down, Depressed, Hopeless 0  PHQ - 2 Score 0  Altered sleeping 0  Tired, decreased energy 0  Change in appetite 0  Feeling bad or failure about yourself  0  Trouble concentrating 0  Moving slowly or fidgety/restless 0  Suicidal thoughts 0  PHQ-9 Score 0   SEXUAL/REPRODUCTIVE HEALTH  Sexually active in the past year? - Yes with male.  STI testing needed/desired today? - no  Any concerns with testosterone/libido? - yes  INFECTIOUS DISEASE SCREENING  HIV - does not need  GC/CT - does not need  HepC - does not need  TB - does not need  CANCER SCREENING  Lung - USPSTF: 55-80yo w/ 30 py hx unless quit w/in 18yr - does not need  Colon - needs - Cologuard   Prostate - does not need  OTHER DISEASE SCREENING  Lipid - needs  DM2 - does not need  AAA - 65-75yo ever smoked: does not need  Osteoporosis - men 60yo+ - does not need  ADULT VACCINATION  Influenza - annual vaccine recommended  Td - booster every 10 years   Zoster - Shingrix recommended 81+ years old  PCV13 - was not indicated  PPSV23 -  already has Immunization History  Administered Date(s) Administered  . Influenza,inj,Quad PF,6+ Mos 06/03/2015  . Influenza-Unspecified 04/15/2016  . Pneumococcal Polysaccharide-23 06/03/2015  . Tdap 06/03/2015     Patient Instructions  As long as I'm getting your A1C's and they are good (7 or so, or less), we can follow-up in the office. Last A1C I have on file is about 2 months ago, so I'll be looking out for one in the next 6 weeks.     Visit summary with medication list and pertinent instructions was printed for patient to review. All questions at time of visit were answered - patient instructed to contact office with any additional concerns. ER/RTC precautions were reviewed with the patient. Follow-up plan: Return in about 4 months (around 08/20/2017) for recheck sugars, sooner if needed. BP check with nurse in 2 weeks .

## 2017-04-19 NOTE — Patient Instructions (Addendum)
As long as I'm getting your A1C's and they are good (7 or so, or less), we can follow-up in the office. Last A1C I have on file is about 2 months ago, so I'll be looking out for one in the next 6 weeks.

## 2017-04-20 NOTE — Addendum Note (Signed)
Addended by: Doree Albee on: 04/20/2017 04:01 PM   Modules accepted: Orders

## 2017-04-21 LAB — COMPLETE METABOLIC PANEL WITH GFR
AG RATIO: 1.4 (calc) (ref 1.0–2.5)
ALBUMIN MSPROF: 4.1 g/dL (ref 3.6–5.1)
ALT: 17 U/L (ref 9–46)
AST: 14 U/L (ref 10–35)
Alkaline phosphatase (APISO): 66 U/L (ref 40–115)
BILIRUBIN TOTAL: 0.4 mg/dL (ref 0.2–1.2)
BUN/Creatinine Ratio: 19 (calc) (ref 6–22)
BUN: 29 mg/dL — AB (ref 7–25)
CO2: 27 mmol/L (ref 20–32)
Calcium: 9.2 mg/dL (ref 8.6–10.3)
Chloride: 104 mmol/L (ref 98–110)
Creat: 1.51 mg/dL — ABNORMAL HIGH (ref 0.70–1.25)
GFR, Est African American: 57 mL/min/{1.73_m2} — ABNORMAL LOW (ref >=60)
GFR, Est Non African American: 49 mL/min/{1.73_m2} — ABNORMAL LOW (ref >=60)
GLUCOSE: 247 mg/dL — AB (ref 65–99)
Globulin: 3 g/dL (calc) (ref 1.9–3.7)
POTASSIUM: 5 mmol/L (ref 3.5–5.3)
SODIUM: 139 mmol/L (ref 135–146)
Total Protein: 7.1 g/dL (ref 6.1–8.1)

## 2017-04-21 LAB — CBC
HCT: 44.6 % (ref 38.5–50.0)
HEMOGLOBIN: 15.2 g/dL (ref 13.2–17.1)
MCH: 30.4 pg (ref 27.0–33.0)
MCHC: 34.1 g/dL (ref 32.0–36.0)
MCV: 89.2 fL (ref 80.0–100.0)
MPV: 11.5 fL (ref 7.5–12.5)
Platelets: 230 10*3/uL (ref 140–400)
RBC: 5 10*6/uL (ref 4.20–5.80)
RDW: 12.7 % (ref 11.0–15.0)
WBC: 8 10*3/uL (ref 3.8–10.8)

## 2017-04-21 LAB — LIPID PANEL
CHOLESTEROL: 144 mg/dL (ref <200)
HDL: 27 mg/dL — AB (ref >40)
LDL Cholesterol (Calc): 79 mg/dL (calc)
NON-HDL CHOLESTEROL (CALC): 117 mg/dL (ref <130)
Total CHOL/HDL Ratio: 5.3 (calc) — ABNORMAL HIGH (ref <5.0)
Triglycerides: 290 mg/dL — ABNORMAL HIGH (ref <150)

## 2017-04-21 LAB — HEMOGLOBIN A1C
HEMOGLOBIN A1C: 7.6 %{Hb} — AB (ref <5.7)
Mean Plasma Glucose: 171 (calc)
eAG (mmol/L): 9.5 (calc)

## 2017-04-22 NOTE — Addendum Note (Signed)
Addended by: Maryla Morrow on: 04/22/2017 12:14 PM   Modules accepted: Orders

## 2017-05-20 LAB — BASIC METABOLIC PANEL WITH GFR
BUN/Creatinine Ratio: 18 (calc) (ref 6–22)
BUN: 23 mg/dL (ref 7–25)
CALCIUM: 9.4 mg/dL (ref 8.6–10.3)
CO2: 27 mmol/L (ref 20–32)
CREATININE: 1.26 mg/dL — AB (ref 0.70–1.25)
Chloride: 103 mmol/L (ref 98–110)
GFR, EST AFRICAN AMERICAN: 71 mL/min/{1.73_m2} (ref >=60)
GFR, EST NON AFRICAN AMERICAN: 62 mL/min/{1.73_m2} (ref >=60)
Glucose, Bld: 130 mg/dL — ABNORMAL HIGH (ref 65–99)
Potassium: 5.2 mmol/L (ref 3.5–5.3)
Sodium: 136 mmol/L (ref 135–146)

## 2017-05-21 LAB — HEMOGLOBIN A1C
HEMOGLOBIN A1C: 7.7 %{Hb} — AB (ref <5.7)
MEAN PLASMA GLUCOSE: 174 (calc)
eAG (mmol/L): 9.7 (calc)

## 2017-06-09 ENCOUNTER — Other Ambulatory Visit: Payer: Self-pay | Admitting: Osteopathic Medicine

## 2017-06-09 DIAGNOSIS — Z794 Long term (current) use of insulin: Secondary | ICD-10-CM

## 2017-06-09 DIAGNOSIS — E119 Type 2 diabetes mellitus without complications: Secondary | ICD-10-CM

## 2017-06-22 ENCOUNTER — Other Ambulatory Visit: Payer: Self-pay | Admitting: Osteopathic Medicine

## 2017-06-22 DIAGNOSIS — I10 Essential (primary) hypertension: Secondary | ICD-10-CM

## 2017-06-22 DIAGNOSIS — E1165 Type 2 diabetes mellitus with hyperglycemia: Secondary | ICD-10-CM

## 2017-06-22 DIAGNOSIS — E785 Hyperlipidemia, unspecified: Secondary | ICD-10-CM

## 2017-07-14 ENCOUNTER — Other Ambulatory Visit: Payer: Self-pay | Admitting: Osteopathic Medicine

## 2017-07-14 DIAGNOSIS — E119 Type 2 diabetes mellitus without complications: Secondary | ICD-10-CM

## 2017-07-14 DIAGNOSIS — Z794 Long term (current) use of insulin: Secondary | ICD-10-CM

## 2017-07-20 ENCOUNTER — Other Ambulatory Visit: Payer: Self-pay | Admitting: Osteopathic Medicine

## 2017-08-04 ENCOUNTER — Other Ambulatory Visit: Payer: Self-pay | Admitting: Osteopathic Medicine

## 2017-08-04 DIAGNOSIS — G8929 Other chronic pain: Secondary | ICD-10-CM

## 2017-08-04 DIAGNOSIS — M25511 Pain in right shoulder: Principal | ICD-10-CM

## 2017-08-23 ENCOUNTER — Ambulatory Visit: Payer: 59 | Admitting: Osteopathic Medicine

## 2017-08-23 ENCOUNTER — Encounter: Payer: Self-pay | Admitting: Osteopathic Medicine

## 2017-08-23 VITALS — BP 135/80 | HR 77 | Temp 97.9°F | Ht 68.0 in | Wt 228.1 lb

## 2017-08-23 DIAGNOSIS — I10 Essential (primary) hypertension: Secondary | ICD-10-CM | POA: Diagnosis not present

## 2017-08-23 DIAGNOSIS — E119 Type 2 diabetes mellitus without complications: Secondary | ICD-10-CM

## 2017-08-23 DIAGNOSIS — E782 Mixed hyperlipidemia: Secondary | ICD-10-CM | POA: Diagnosis not present

## 2017-08-23 DIAGNOSIS — Z794 Long term (current) use of insulin: Secondary | ICD-10-CM | POA: Diagnosis not present

## 2017-08-23 LAB — POCT GLYCOSYLATED HEMOGLOBIN (HGB A1C): HEMOGLOBIN A1C: 7.9

## 2017-08-23 NOTE — Progress Notes (Signed)
HPI: Jesse Calhoun is a 62 y.o. male  who presents to [facility] today, 08/23/17,  for chief complaint of:  DM2 HTN     DIABETES SCREENING/PREVENTIVE CARE: A1C past 3-6 mos: (note: he gets these through work but we don't often get records in a timely manner)   02/15/17: 7.4%  05/20/17: 7.7% --> Basaglar 42 units, discussed   08/23/17: 7.9% -> Discussed insulin titration up, reduce portion size and carb intake, will need to consider mealtime insulin if fasting levels ok and A1C not better  BP <140/90: Yes  LDL goal <70: close - 79 04/19/17 Eye exam annually: Yes but no records, importance discussed with patient Foot exam: 10/15/16 normal Microalbuminuria: n/a Metformin: intolerant ACE/ARB: Yes Statin: Yes  Pneumovax: Yes   HTN: No chest pain, pressure, shortness of breath. No home blood pressures to report. BP elevated on intake today, was also elevated at last visit but he didn't RTC for nurse visit to recheck. Medications as below.      Past medical history, surgical history, social history and family history reviewed.  Patient Active Problem List   Diagnosis Date Noted  . Type 2 diabetes mellitus without complication, with long-term current use of insulin ([place]) 07/21/2015  . Hyperlipidemia 03/26/2015  . Hoarseness of voice 03/19/2015  . Essential hypertension 03/19/2015    Current medication list and allergy/intolerance information reviewed.   Current Outpatient Medications on File Prior to Visit  Medication Sig Dispense Refill  . ACCU-CHEK AVIVA PLUS test strip TEST TWICE DAILY AS DIRECTED 100 each prn  . ACCU-CHEK SOFTCLIX LANCETS lancets TEST TWICE DAILY AS DIRECTED 100 each prn  . atorvastatin (LIPITOR) 40 MG tablet Take 1 tablet (40 mg total) by mouth daily at 6 PM. Patient must keep follow up appt 90 tablet 0  . B-D ULTRAFINE III SHORT PEN 31G X 8 MM MISC USE AS DAILY AS DIRECTED 100 each prn  . Insulin Glargine (BASAGLAR  KWIKPEN) 100 UNIT/ML SOPN Inject 0.42 mLs (42 Units total) into the skin at bedtime. 5 pen 6  . lisinopril (PRINIVIL,ZESTRIL) 40 MG tablet Take 1 tablet (40 mg total) by mouth daily. Patient must keep follow up appt 90 tablet 0  . naproxen (NAPROSYN) 500 MG tablet TAKE 1 TABLET(500 MG) BY MOUTH TWICE DAILY WITH A MEAL AS NEEDED FOR SHOULDER OR PAIN 60 tablet 0   No current facility-administered medications on file prior to visit.    No Known Allergies    Review of Systems:  Constitutional: No recent illness  HEENT: No  headache, no vision change  Cardiac: No  chest pain, No  pressure, No palpitations  Respiratory:  No  shortness of breath. No  Cough  Gastrointestinal: No  abdominal pain  Neurologic: No  weakness, No  Dizziness   Exam:  BP 135/80   Pulse 77   Temp 97.9 F (36.6 C) (Oral)   Ht 5\' 8"  (1.727 m)   Wt 228 lb 1.9 oz (103.5 kg)   BMI 34.69 kg/m   Constitutional: VS see above. General Appearance: alert, well-developed, well-nourished, NAD  Eyes: Normal lids and conjunctive, non-icteric sclera  Ears, Nose, Mouth, Throat: MMM, Normal external inspection ears/nares/mouth/lips/gums.  Neck: No masses, trachea midline.   Respiratory: Normal respiratory effort.  Musculoskeletal: Gait normal. Symmetric and independent movement of all extremities  Neurological: Normal balance/coordination. No tremor.  Skin: warm, dry, intact.   Psychiatric: Normal judgment/insight. Normal mood and affect. Oriented x3.    Recent Results (from  the past 2160 hour(s))  POCT HgB A1C     Status: None   Collection Time: 08/23/17  7:26 AM  Result Value Ref Range   Hemoglobin A1C 7.9     ASSESSMENT/PLAN:   Type 2 diabetes mellitus without complication, with long-term current use of insulin ([place]) - Plan: POCT HgB A1C  Essential hypertension  Mixed hyperlipidemia    Patient Instructions  For Diabetes  Measure fasting blood sugar every day: goal for now is to get this to  80-130  Increase daily Insulin dose by 2-3 units at a time, twice per week, until fasting sugars are consistently 80-130, then continue at that dose  Plan to recheck A1C in 3 months  Plan to follow-up in the office sooner if you experience low sugars   Plan to follow-up in the office sooner if your fasting sugars are consistently high  Bring all sugar readings with you to your office visits     Follow-up plan: Return in about 3 months (around 11/20/2017) for A1C recheck .  Visit summary with medication list and pertinent instructions was printed for patient to review, alert [place] if any changes needed. All questions at time of visit were answered - patient instructed to contact office with any additional concerns. ER/RTC precautions were reviewed with the patient and understanding verbalized.

## 2017-08-23 NOTE — Patient Instructions (Addendum)
For Diabetes  Measure fasting blood sugar every day: goal for now is to get this to 80-130  Increase daily Insulin dose by 2-3 units at a time, twice per week, until fasting sugars are consistently 80-130, then continue at that dose  Plan to recheck A1C in 3 months  Plan to follow-up in the office sooner if you experience low sugars   Plan to follow-up in the office sooner if your fasting sugars are consistently high  Bring all sugar readings with you to your office visits

## 2017-09-24 ENCOUNTER — Other Ambulatory Visit: Payer: Self-pay | Admitting: Osteopathic Medicine

## 2017-09-24 DIAGNOSIS — E119 Type 2 diabetes mellitus without complications: Secondary | ICD-10-CM

## 2017-09-24 DIAGNOSIS — Z794 Long term (current) use of insulin: Secondary | ICD-10-CM

## 2017-10-04 LAB — HEMOGLOBIN A1C: Hemoglobin A1C: 8

## 2017-10-26 ENCOUNTER — Other Ambulatory Visit: Payer: Self-pay | Admitting: Osteopathic Medicine

## 2017-10-26 DIAGNOSIS — M25511 Pain in right shoulder: Secondary | ICD-10-CM

## 2017-10-26 DIAGNOSIS — E1165 Type 2 diabetes mellitus with hyperglycemia: Secondary | ICD-10-CM

## 2017-10-26 DIAGNOSIS — E119 Type 2 diabetes mellitus without complications: Secondary | ICD-10-CM

## 2017-10-26 DIAGNOSIS — G8929 Other chronic pain: Secondary | ICD-10-CM

## 2017-10-26 DIAGNOSIS — I10 Essential (primary) hypertension: Secondary | ICD-10-CM

## 2017-10-26 DIAGNOSIS — E785 Hyperlipidemia, unspecified: Secondary | ICD-10-CM

## 2017-10-26 DIAGNOSIS — Z794 Long term (current) use of insulin: Secondary | ICD-10-CM

## 2017-11-22 ENCOUNTER — Ambulatory Visit (INDEPENDENT_AMBULATORY_CARE_PROVIDER_SITE_OTHER): Payer: 59 | Admitting: Osteopathic Medicine

## 2017-11-22 ENCOUNTER — Encounter: Payer: Self-pay | Admitting: Osteopathic Medicine

## 2017-11-22 ENCOUNTER — Other Ambulatory Visit: Payer: Self-pay

## 2017-11-22 VITALS — BP 145/85 | HR 69 | Temp 98.0°F | Wt 229.6 lb

## 2017-11-22 DIAGNOSIS — E782 Mixed hyperlipidemia: Secondary | ICD-10-CM

## 2017-11-22 DIAGNOSIS — M79675 Pain in left toe(s): Secondary | ICD-10-CM

## 2017-11-22 DIAGNOSIS — Z794 Long term (current) use of insulin: Secondary | ICD-10-CM | POA: Diagnosis not present

## 2017-11-22 DIAGNOSIS — E119 Type 2 diabetes mellitus without complications: Secondary | ICD-10-CM

## 2017-11-22 DIAGNOSIS — I10 Essential (primary) hypertension: Secondary | ICD-10-CM | POA: Diagnosis not present

## 2017-11-22 LAB — POCT GLYCOSYLATED HEMOGLOBIN (HGB A1C): Hemoglobin A1C: 8.2 % — AB (ref 4.0–5.6)

## 2017-11-22 MED ORDER — BASAGLAR KWIKPEN 100 UNIT/ML ~~LOC~~ SOPN: 42.0000 [IU] | PEN_INJECTOR | Freq: Every day | SUBCUTANEOUS | 11 refills | Status: DC

## 2017-11-22 MED ORDER — ATORVASTATIN CALCIUM 40 MG PO TABS: 40.0000 mg | ORAL_TABLET | Freq: Every day | ORAL | 3 refills | Status: DC

## 2017-11-22 NOTE — Patient Instructions (Addendum)
For Diabetes  Measure fasting blood sugar every day in the morning: goal for now is to get this to 80-130  Take insulin in evenings   Increase daily Insulin dose by 3 units at a time, twice per week, until fasting sugars are consistently 80-130, then continue at that dose  Plan to recheck A1C in 3 months  Plan to follow-up in the office sooner if you experience low sugars   Plan to follow-up in the office sooner if your fasting sugars are consistently high  Bring all sugar readings with you to your office visits

## 2017-11-22 NOTE — Progress Notes (Signed)
HPI: Jesse Calhoun is a 62 y.o. male  who presents to [facility] today, 11/22/17,  for chief complaint of:  DM2 HTN     DIABETES SCREENING/PREVENTIVE CARE: A1C past 3-6 mos: (note: he gets these through work but we don't often get records in a timely manner)   02/15/17: 7.4%  05/20/17: 7.7% --> Basaglar 42 units, discussed   08/23/17: 7.9% --> Discussed insulin titration up, reduce portion size and carb intake, will need to consider mealtime insulin if fasting levels ok and A1C not better   11/22/17: 8.2% --> taking 46 units usually, he is taking it in the mornings BP <130/80: 141/72 today, bit better on recheck  LDL goal <70: close! - 79 04/19/17 Eye exam annually: Yes but no records, importance discussed with patient Foot exam: 10/15/16 normal,  Microalbuminuria: n/a on ACE/ARB Metformin: intolerant ACE/ARB: Yes Statin: Yes  Pneumovax: Yes   HTN: No chest pain, pressure, shortness of breath. No home blood pressures to report.   MSK: wants me to look at 5th L toe real quick, looks a bit purplish to him, recently started walking trails for exercise, not painful    Past medical history, surgical history, social history and family history reviewed.  Patient Active Problem List   Diagnosis Date Noted  . Type 2 diabetes mellitus without complication, with long-term current use of insulin ([place]) 07/21/2015  . Hyperlipidemia 03/26/2015  . Hoarseness of voice 03/19/2015  . Essential hypertension 03/19/2015    Current medication list and allergy/intolerance information reviewed.   Current Outpatient Medications on File Prior to Visit  Medication Sig Dispense Refill  . ACCU-CHEK AVIVA PLUS test strip TEST TWICE DAILY AS DIRECTED 100 each prn  . ACCU-CHEK SOFTCLIX LANCETS lancets TEST TWICE DAILY AS DIRECTED 100 each prn  . atorvastatin (LIPITOR) 40 MG tablet TAKE 1 TABLET(40 MG) BY MOUTH DAILY AT 6 PM. FOLLOW UP APPOINTMENT 90 tablet 3  . B-D  ULTRAFINE III SHORT PEN 31G X 8 MM MISC USE AS DAILY AS DIRECTED 100 each prn  . Insulin Glargine (BASAGLAR KWIKPEN) 100 UNIT/ML SOPN Inject 0.42 mLs (42 Units total) into the skin at bedtime. 5 pen 6  . Insulin Glargine (BASAGLAR KWIKPEN) 100 UNIT/ML SOPN INJECT 42 UNITS SUBCUTANEOUS EVERY NIGHT AT BEDTIME 5 pen 5  . lisinopril (PRINIVIL,ZESTRIL) 40 MG tablet TAKE 1 TABLET BY MOUTH DAILY 90 tablet 3  . naproxen (NAPROSYN) 500 MG tablet TAKE 1 TABLET(500 MG) BY MOUTH TWICE DAILY WITH A MEAL AS NEEDED FOR SHOULDER OR PAIN 60 tablet 3   No current facility-administered medications on file prior to visit.    No Known Allergies    Review of Systems:  Constitutional: No recent illness  HEENT: No  headache, no vision change  Cardiac: No  chest pain, No  pressure, No palpitations  Respiratory:  No  shortness of breath. No  Cough  Gastrointestinal: No  abdominal pain  Neurologic: No  weakness, No  Dizziness   Exam:  BP (!) 145/85   Pulse 69   Temp 98 F (36.7 C) (Oral)   Wt 229 lb 9.6 oz (104.1 kg)   BMI 34.91 kg/m   Constitutional: VS see above. General Appearance: alert, well-developed, well-nourished, NAD  Eyes: Normal lids and conjunctive, non-icteric sclera  Ears, Nose, Mouth, Throat: MMM, Normal external inspection ears/nares/mouth/lips/gums.  Neck: No masses, trachea midline.   Respiratory: Normal respiratory effort.  Musculoskeletal: Gait normal. Symmetric and independent movement of all extremities, see photo toe  below   Neurological: Normal balance/coordination. No tremor.  Skin: warm, dry, intact.   Psychiatric: Normal judgment/insight. Normal mood and affect. Oriented x3.        Recent Results (from the past 2160 hour(s))  POCT HgB A1C     Status: Abnormal   Collection Time: 11/22/17  7:28 AM  Result Value Ref Range   Hemoglobin A1C 8.2 (A) 4.0 - 5.6 %   HbA1c, POC (prediabetic range)  5.7 - 6.4 %   HbA1c, POC (controlled diabetic range)  0.0 - 7.0  %    ASSESSMENT/PLAN:   Type 2 diabetes mellitus without complication, with long-term current use of insulin ([place]) - Plan: POCT HgB A1C  Essential hypertension - not quite at goal, he's had a lot of caffeine and a cigar today, he's started exercising - if not better at recheck will consider adjust meds  Mixed hyperlipidemia - Plan: atorvastatin (LIPITOR) 40 MG tablet  Toe pain, left - slight discoloration without pain since started walking, will monitor, advised footwear that allows toes to spread     Patient Instructions  For Diabetes  Measure fasting blood sugar every day in the morning: goal for now is to get this to 80-130  Take insulin in evenings   Increase daily Insulin dose by 3 units at a time, twice per week, until fasting sugars are consistently 80-130, then continue at that dose  Plan to recheck A1C in 3 months  Plan to follow-up in the office sooner if you experience low sugars   Plan to follow-up in the office sooner if your fasting sugars are consistently high  Bring all sugar readings with you to your office visits     Follow-up plan: Return in about 3 months (around 02/22/2018) for A1C for DIABETES recheck, see me sooner if needed .  Visit summary with medication list and pertinent instructions was printed for patient to review, alert [place] if any changes needed. All questions at time of visit were answered - patient instructed to contact office with any additional concerns. ER/RTC precautions were reviewed with the patient and understanding verbalized.

## 2017-11-23 ENCOUNTER — Encounter: Payer: Self-pay | Admitting: Osteopathic Medicine

## 2018-02-21 ENCOUNTER — Ambulatory Visit: Payer: 59 | Admitting: Osteopathic Medicine

## 2018-02-22 ENCOUNTER — Ambulatory Visit (INDEPENDENT_AMBULATORY_CARE_PROVIDER_SITE_OTHER): Payer: 59 | Admitting: Osteopathic Medicine

## 2018-02-22 ENCOUNTER — Encounter: Payer: Self-pay | Admitting: Osteopathic Medicine

## 2018-02-22 VITALS — BP 138/80 | HR 79 | Temp 97.8°F | Ht 68.0 in | Wt 231.0 lb

## 2018-02-22 DIAGNOSIS — Z794 Long term (current) use of insulin: Secondary | ICD-10-CM | POA: Diagnosis not present

## 2018-02-22 DIAGNOSIS — E119 Type 2 diabetes mellitus without complications: Secondary | ICD-10-CM

## 2018-02-22 DIAGNOSIS — I10 Essential (primary) hypertension: Secondary | ICD-10-CM | POA: Diagnosis not present

## 2018-02-22 LAB — POCT GLYCOSYLATED HEMOGLOBIN (HGB A1C): Hemoglobin A1C: 7.6 % — AB (ref 4.0–5.6)

## 2018-02-22 NOTE — Progress Notes (Signed)
HPI: Jesse Calhoun is a 62 y.o. male  who presents to [facility] today, 02/22/18,  for chief complaint of:  DM2 HTN     DIABETES SCREENING/PREVENTIVE CARE: A1C past 3-6 mos: (note: he gets these through work but we don't often get records in a timely manner)   02/15/17: 7.4%  05/20/17: 7.7% --> Basaglar 42 units, discussed diet/exercise  08/23/17: 7.9% --> Discussed insulin titration up, reduce portion size and carb intake, will need to consider mealtime insulin if fasting levels ok and A1C not better   11/22/17: 8.2% --> taking 46 units usually, he is taking it in the mornings. Discussed ideally take in evenings, and TITRATE. UP. BASED ON FASTING GLC LEVELS. Instructions printed and reviewed.   02/22/18: 7.6% <-- taking 46 units daily, taking in the evenings. Haven't been able to get consistent w/ taking it in the evenings ---> take in AM ok, he has been nervois to go up on insulin since he lives alon he doesn't want Glc to drop. Home readings, consistely 130s-150s, sometimes into 100s  BP <130/80: 141/72 today, bit better on recheck  LDL goal <70: close! - 79 04/19/17 Eye exam annually: Yes but no records, importance discussed with patient Foot exam:  Microalbuminuria: n/a on ACE/ARB Metformin: intolerant ACE/ARB: Yes Statin: Yes  Pneumovax: Yes   HTN: No chest pain, pressure, shortness of breath. No home blood pressures to report. BP elevated a bit on intake, better on recheck       Past medical history, surgical history, social history and family history reviewed.  Patient Active Problem List   Diagnosis Date Noted  . Type 2 diabetes mellitus without complication, with long-term current use of insulin ([place]) 07/21/2015  . Hyperlipidemia 03/26/2015  . Hoarseness of voice 03/19/2015  . Essential hypertension 03/19/2015    Current medication list and allergy/intolerance information reviewed.   Current Outpatient Medications on File Prior  to Visit  Medication Sig Dispense Refill  . ACCU-CHEK AVIVA PLUS test strip TEST TWICE DAILY AS DIRECTED 100 each prn  . ACCU-CHEK SOFTCLIX LANCETS lancets TEST TWICE DAILY AS DIRECTED 100 each prn  . atorvastatin (LIPITOR) 40 MG tablet Take 1 tablet (40 mg total) by mouth daily. 90 tablet 3  . B-D ULTRAFINE III SHORT PEN 31G X 8 MM MISC USE AS DAILY AS DIRECTED 100 each prn  . Insulin Glargine (BASAGLAR KWIKPEN) 100 UNIT/ML SOPN Inject 0.42 mLs (42 Units total) into the skin at bedtime. Titrate up as directed 5 pen 11  . lisinopril (PRINIVIL,ZESTRIL) 40 MG tablet TAKE 1 TABLET BY MOUTH DAILY 90 tablet 3  . naproxen (NAPROSYN) 500 MG tablet TAKE 1 TABLET(500 MG) BY MOUTH TWICE DAILY WITH A MEAL AS NEEDED FOR SHOULDER OR PAIN 60 tablet 3   No current facility-administered medications on file prior to visit.    No Known Allergies    Review of Systems:  Constitutional: No recent illness  HEENT: No  headache, no vision change  Cardiac: No  chest pain, No  pressure, No palpitations  Respiratory:  No  shortness of breath. No  Cough  Gastrointestinal: No  abdominal pain  Neurologic: No  weakness, No  Dizziness   Exam:  BP 138/80   Pulse 79   Temp 97.8 F (36.6 C) (Oral)   Ht 5\' 8"  (1.727 m)   Wt 231 lb (104.8 kg)   BMI 35.12 kg/m   Constitutional: VS see above. General Appearance: alert, well-developed, well-nourished, NAD  Eyes: Normal  lids and conjunctive, non-icteric sclera  Ears, Nose, Mouth, Throat: MMM, Normal external inspection ears/nares/mouth/lips/gums.  Neck: No masses, trachea midline.   Respiratory: Normal respiratory effort.  Musculoskeletal: Gait normal. Symmetric and independent movement of all extremities, see photo toe below   Neurological: Normal balance/coordination. No tremor.  Skin: warm, dry, intact.   Psychiatric: Normal judgment/insight. Normal mood and affect. Oriented x3.        Recent Results (from the past 2160 hour(s))  POCT HgB  A1C     Status: Abnormal   Collection Time: 02/22/18  8:36 AM  Result Value Ref Range   Hemoglobin A1C 7.6 (A) 4.0 - 5.6 %   HbA1c POC (<> result, manual entry)     HbA1c, POC (prediabetic range)     HbA1c, POC (controlled diabetic range)        ASSESSMENT/PLAN:   Type 2 diabetes mellitus without complication, with long-term current use of insulin ([place]) - Plan: POCT HgB A1C  Essential hypertension    Patient Instructions  For Diabetes  Measure fasting blood sugar every day: goal for now is to get this to 90-130  Increase daily Insulin dose by 3 units at a time, once per week, until fasting sugars are consistently 90-130, then continue at that dose.   Plan to recheck A1C in 3 months  Plan to follow-up in the office sooner or call us if you experience low sugars (<70)    Plan to follow-up in the office sooner if your fasting sugars are consistently high despite increasing dose insulin   Bring all sugar readings with you to your office visits     Follow-up plan: Return in about 3 months (around 05/25/2018) for A1C recheck, sooner if needed.  Visit summary with medication list and pertinent instructions was printed for patient to review, alert [place] if any changes needed. All questions at time of visit were answered - patient instructed to contact office with any additional concerns. ER/RTC precautions were reviewed with the patient and understanding verbalized.    Total time spent 25 minutes, greater than 50% of the visit was f5f  counseling and coordinating care for diagnosis of The primary encounter diagnosis was Type 2 diabetes mellitus without complication, with long-term current use of insulin ([place]). A diagnosis of Essential hypertension was also pertinent to this visit.

## 2018-02-22 NOTE — Patient Instructions (Addendum)
For Diabetes  Measure fasting blood sugar every day: goal for now is to get this to 90-130  Increase daily Insulin dose by 3 units at a time, once per week, until fasting sugars are consistently 90-130, then continue at that dose.   Plan to recheck A1C in 3 months  Plan to follow-up in the office sooner or call us if you experience low sugars (<70)    Plan to follow-up in the office sooner if your fasting sugars are consistently high despite increasing dose insulin   Bring all sugar readings with you to your office visits

## 2018-05-25 ENCOUNTER — Encounter: Payer: Self-pay | Admitting: Osteopathic Medicine

## 2018-05-25 ENCOUNTER — Ambulatory Visit (INDEPENDENT_AMBULATORY_CARE_PROVIDER_SITE_OTHER): Payer: 59 | Admitting: Osteopathic Medicine

## 2018-05-25 VITALS — BP 129/74 | HR 71 | Temp 98.3°F | Wt 226.1 lb

## 2018-05-25 DIAGNOSIS — E119 Type 2 diabetes mellitus without complications: Secondary | ICD-10-CM

## 2018-05-25 DIAGNOSIS — Z794 Long term (current) use of insulin: Secondary | ICD-10-CM | POA: Diagnosis not present

## 2018-05-25 DIAGNOSIS — E782 Mixed hyperlipidemia: Secondary | ICD-10-CM

## 2018-05-25 DIAGNOSIS — I10 Essential (primary) hypertension: Secondary | ICD-10-CM

## 2018-05-25 DIAGNOSIS — Z87442 Personal history of urinary calculi: Secondary | ICD-10-CM

## 2018-05-25 DIAGNOSIS — R319 Hematuria, unspecified: Secondary | ICD-10-CM

## 2018-05-25 LAB — POCT GLYCOSYLATED HEMOGLOBIN (HGB A1C): HEMOGLOBIN A1C: 8.1 % — AB (ref 4.0–5.6)

## 2018-05-25 MED ORDER — METFORMIN HCL ER 500 MG PO TB24: 1000.0000 mg | ORAL_TABLET | Freq: Every day | ORAL | 3 refills | Status: DC

## 2018-05-25 NOTE — Patient Instructions (Signed)
For Diabetes  Will restart Metformin   Measure fasting blood sugar every day: goal for now is to get this to 80-130  Increase daily Insulin dose by 3 units at a time, twice per week, until fasting sugars are consistently 80-130, then continue at that dose  Plan to recheck A1C in 3 months  Plan to follow-up in the office sooner if you experience low sugars   Plan to follow-up in the office sooner if your fasting sugars are consistently high  Bring all sugar readings with you to your office visits

## 2018-05-25 NOTE — Progress Notes (Signed)
HPI: Jesse Calhoun is a 62 y.o. male  who presents to [facility] today, 05/25/18,  for chief complaint of:  DM2 HTN     DIABETES SCREENING/PREVENTIVE CARE: A1C past 3-6 mos: (note: he gets these through work but we don't often get records in a timely manner)   05/20/17: 7.7% --> Basaglar 42 units, discussed diet/exercise  08/23/17: 7.9% --> Discussed insulin titration up, reduce portion size and carb intake, will need to consider mealtime insulin if fasting levels ok and A1C not better   11/22/17: 8.2% --> taking 46 units usually, he is taking it in the mornings. --> Discussed ideally take in evenings, and TITRATE. UP. BASED ON FASTING GLC LEVELS. Instructions printed and reviewed.   02/22/18: 7.6% <-- taking 46 units daily, taking in the evenings. Haven't been able to get consistent w/ taking it in the evenings ---> take in AM ok, he has been nervois to go up on insulin since he lives alon he doesn't want Glc to drop. Home readings, consistely 130s-150s, sometimes into 100s. Again discussed titrating up on Insulin.   Today, 05/25/18: 8.1% <-- taking 50 units daily.  Fating Glc 120s on weekend, 150-170's during the week, he'd like to restart metformin --> restart Metformin, Insulin to 55 units during the week (traveling makes diet compiance difficult)  BP <130/80:  BP Readings from Last 3 Encounters:  05/25/18 129/74  02/22/18 138/80  11/22/17 (!) 145/85  LDL goal <70: close! - 79 04/19/17 Eye exam annually: Yes but no records, importance discussed with patient Foot exam:  Microalbuminuria: n/a on ACE/ARB Metformin: intolerant but he thinks this might have been GI issues d/t abx at the time, would like to restart  ACE/ARB: Yes Statin: Yes  Pneumovax: Yes   HTN: No chest pain, pressure, shortness of breath. No home blood pressures to report. BP elevated a bit on intake, better on recheck    Hematuria/kidney stone: About a month ago, was having  some lower back pain and then noticed a few drops of blood in his urine and heard something pop into the toilet which he thinks may have been a kidney stone, he also mentions passing a fragment of fleshy material about half the size of the fingernail.      Past medical history, surgical history, social history and family history reviewed.  Patient Active Problem List   Diagnosis Date Noted  . Type 2 diabetes mellitus without complication, with long-term current use of insulin ([place]) 07/21/2015  . Hyperlipidemia 03/26/2015  . Hoarseness of voice 03/19/2015  . Essential hypertension 03/19/2015    Current medication list and allergy/intolerance information reviewed.   Current Outpatient Medications on File Prior to Visit  Medication Sig Dispense Refill  . ACCU-CHEK AVIVA PLUS test strip TEST TWICE DAILY AS DIRECTED 100 each prn  . ACCU-CHEK SOFTCLIX LANCETS lancets TEST TWICE DAILY AS DIRECTED 100 each prn  . atorvastatin (LIPITOR) 40 MG tablet Take 1 tablet (40 mg total) by mouth daily. 90 tablet 3  . B-D ULTRAFINE III SHORT PEN 31G X 8 MM MISC USE AS DAILY AS DIRECTED 100 each prn  . Insulin Glargine (BASAGLAR KWIKPEN) 100 UNIT/ML SOPN Inject 0.42 mLs (42 Units total) into the skin at bedtime. Titrate up as directed 5 pen 11  . lisinopril (PRINIVIL,ZESTRIL) 40 MG tablet TAKE 1 TABLET BY MOUTH DAILY 90 tablet 3  . naproxen (NAPROSYN) 500 MG tablet TAKE 1 TABLET(500 MG) BY MOUTH TWICE DAILY WITH A MEAL AS NEEDED  FOR SHOULDER OR PAIN 60 tablet 3   No current facility-administered medications on file prior to visit.    No Known Allergies    Review of Systems:  Constitutional: No recent illness  HEENT: No  headache, no vision change  Cardiac: No  chest pain, No  pressure, No palpitations  Respiratory:  No  shortness of breath. No  Cough  Gastrointestinal: No  abdominal pain  Neurologic: No  weakness, No  Dizziness   Exam:  BP 129/74 (BP Location: Left Arm, Patient Position:  Sitting, Cuff Size: Normal)   Pulse 71   Temp 98.3 F (36.8 C) (Oral)   Wt 226 lb 1.6 oz (102.6 kg)   BMI 34.38 kg/m   Constitutional: VS see above. General Appearance: alert, well-developed, well-nourished, NAD  Eyes: Normal lids and conjunctive, non-icteric sclera  Ears, Nose, Mouth, Throat: MMM, Normal external inspection ears/nares/mouth/lips/gums.  Neck: No masses, trachea midline.   Respiratory: Normal respiratory effort.  Musculoskeletal: Gait normal. Symmetric and independent movement of all extremities, see photo toe below   Neurological: Normal balance/coordination. No tremor.  Skin: warm, dry, intact.   Psychiatric: Normal judgment/insight. Normal mood and affect. Oriented x3.        Recent Results (from the past 2160 hour(s))  POCT HgB A1C     Status: Abnormal   Collection Time: 05/25/18  7:46 AM  Result Value Ref Range   Hemoglobin A1C 8.1 (A) 4.0 - 5.6 %   HbA1c POC (<> result, manual entry)     HbA1c, POC (prediabetic range)     HbA1c, POC (controlled diabetic range)        ASSESSMENT/PLAN:   Type 2 diabetes mellitus without complication, with long-term current use of insulin ([place]) - Plan: POCT HgB A1C  Essential hypertension  Mixed hyperlipidemia  History of kidney stones - Plan: Urine Culture, Urine Microscopic  Hematuria, unspecified type - Plan: Urine Culture, Urine Microscopic    Patient Instructions  For Diabetes  Will restart Metformin   Measure fasting blood sugar every day: goal for now is to get this to 80-130  Increase daily Insulin dose by 3 units at a time, twice per week, until fasting sugars are consistently 80-130, then continue at that dose  Plan to recheck A1C in 3 months  Plan to follow-up in the office sooner if you experience low sugars   Plan to follow-up in the office sooner if your fasting sugars are consistently high  Bring all sugar readings with you to your office visits       Follow-up plan:  Return in about 3 months (around 08/25/2018) for A1C DIABETES FOLLOW-UP, sooner if needed .  Visit summary with medication list and pertinent instructions was printed for patient to review, alert [place] if any changes needed. All questions at time of visit were answered - patient instructed to contact office with any additional concerns. ER/RTC precautions were reviewed with the patient and understanding verbalized.    Total time spent 25 minutes, greater than 50% of the visit was f97f  counseling and coordinating care for diagnosis of The primary encounter diagnosis was Type 2 diabetes mellitus without complication, with long-term current use of insulin ([place]). Diagnoses of Essential hypertension, Mixed hyperlipidemia, History of kidney stones, and Hematuria, unspecified type were also pertinent to this visit.

## 2018-05-27 LAB — URINE CULTURE
MICRO NUMBER:: 91405383
SPECIMEN QUALITY:: ADEQUATE

## 2018-05-27 LAB — URINALYSIS, MICROSCOPIC ONLY: HYALINE CAST: NONE SEEN /LPF

## 2018-08-11 ENCOUNTER — Other Ambulatory Visit: Payer: Self-pay | Admitting: Osteopathic Medicine

## 2018-08-24 ENCOUNTER — Encounter: Payer: Self-pay | Admitting: Osteopathic Medicine

## 2018-08-24 ENCOUNTER — Ambulatory Visit (INDEPENDENT_AMBULATORY_CARE_PROVIDER_SITE_OTHER): Payer: 59 | Admitting: Osteopathic Medicine

## 2018-08-24 VITALS — BP 149/75 | HR 70 | Temp 98.1°F | Wt 227.7 lb

## 2018-08-24 DIAGNOSIS — Z794 Long term (current) use of insulin: Secondary | ICD-10-CM

## 2018-08-24 DIAGNOSIS — E782 Mixed hyperlipidemia: Secondary | ICD-10-CM

## 2018-08-24 DIAGNOSIS — S86812A Strain of other muscle(s) and tendon(s) at lower leg level, left leg, initial encounter: Secondary | ICD-10-CM | POA: Diagnosis not present

## 2018-08-24 DIAGNOSIS — E119 Type 2 diabetes mellitus without complications: Secondary | ICD-10-CM | POA: Diagnosis not present

## 2018-08-24 DIAGNOSIS — I10 Essential (primary) hypertension: Secondary | ICD-10-CM | POA: Diagnosis not present

## 2018-08-24 DIAGNOSIS — M25552 Pain in left hip: Secondary | ICD-10-CM

## 2018-08-24 LAB — POCT GLYCOSYLATED HEMOGLOBIN (HGB A1C): HEMOGLOBIN A1C: 7.7 % — AB (ref 4.0–5.6)

## 2018-08-24 MED ORDER — ACCU-CHEK SOFTCLIX LANCETS MISC: 1.0000 | Freq: Two times a day (BID) | 99 refills | Status: DC

## 2018-08-24 MED ORDER — GLUCOSE BLOOD VI STRP: 1.0000 | ORAL_STRIP | Freq: Two times a day (BID) | 99 refills | Status: DC

## 2018-08-24 MED ORDER — INSULIN PEN NEEDLE 31G X 8 MM MISC: 1.0000 | 99 refills | Status: DC

## 2018-08-24 MED ORDER — BASAGLAR KWIKPEN 100 UNIT/ML ~~LOC~~ SOPN: 42.0000 [IU] | PEN_INJECTOR | Freq: Every day | SUBCUTANEOUS | 99 refills | Status: DC

## 2018-08-24 MED ORDER — ATORVASTATIN CALCIUM 40 MG PO TABS: 40.0000 mg | ORAL_TABLET | Freq: Every day | ORAL | 3 refills | Status: DC

## 2018-08-24 MED ORDER — LISINOPRIL 40 MG PO TABS: 40.0000 mg | ORAL_TABLET | Freq: Every day | ORAL | 3 refills | Status: DC

## 2018-08-24 MED ORDER — METFORMIN HCL ER 500 MG PO TB24: 1000.0000 mg | ORAL_TABLET | Freq: Every day | ORAL | 3 refills | Status: DC

## 2018-08-24 NOTE — Progress Notes (Signed)
HPI: Jesse Calhoun is a 63 y.o. male  who presents to [facility] today, 08/24/18,  for chief complaint of:  DM2 HTN     DIABETES SCREENING/PREVENTIVE CARE: A1C past 3-6 mos: (note: he gets these through work but we don't often get records in a timely manner)   05/20/17: 7.7% --> Basaglar 42 units, discussed diet/exercise  08/23/17: 7.9% --> Discussed insulin titration up, reduce portion size and carb intake, will need to consider mealtime insulin if fasting levels ok and A1C not better   11/22/17: 8.2% --> taking 46 units usually, he is taking it in the mornings. --> Discussed ideally take in evenings, and TITRATE. UP. BASED ON FASTING GLC LEVELS. Instructions printed and reviewed.   02/22/18: 7.6% <-- taking 46 units daily, taking in the evenings. Haven't been able to get consistent w/ taking it in the evenings ---> take in AM ok, he has been nervois to go up on insulin since he lives alon he doesn't want Glc to drop. Home readings, consistely 130s-150s, sometimes into 100s. Again discussed titrating up on Insulin.   05/25/18: 8.1% <-- taking 50 units daily.  Fating Glc 120s on weekend, 150-170's during the week, he'd like to restart metformin --> restart Metformin, Insulin to 55 units during the week (traveling makes diet compiance difficult)   Today, 08/24/18: 7.7% has not increased insulin dosage.  Is working a bit more on getting some exercising. BP <130/80: sometimes!  BP Readings from Last 3 Encounters:  08/24/18 (!) 149/75 - took meds right before OV  05/25/18 129/74  02/22/18 138/80  LDL goal <70: close! - 79 04/19/17 Eye exam annually: Yes but no records, importance discussed with patient Foot exam:  Microalbuminuria: n/a on ACE/ARB Metformin: intolerant but he thinks this might have been GI issues d/t abx at the time, would like to restart  ACE/ARB: Yes Statin: Yes  Pneumovax: Yes   HTN: No chest pain, pressure, shortness of breath. No  home blood pressures to report. BP elevated a bit on intake, better on recheck patient is not checking this outside the office.  Calf/hip pain: With exercise, patient states he develops some sudden onset cramping in the left calf, if he pushes through it it resolves but then he tends to end up with hip pain.  Does not really sound like claudication, he DENIES bilateral calf pain that improves with rest    Past medical history, surgical history, social history and family history reviewed.  Patient Active Problem List   Diagnosis Date Noted  . Type 2 diabetes mellitus without complication, with long-term current use of insulin ([place]) 07/21/2015  . Hyperlipidemia 03/26/2015  . Hoarseness of voice 03/19/2015  . Essential hypertension 03/19/2015    Current medication list and allergy/intolerance information reviewed.   Current Outpatient Medications on File Prior to Visit  Medication Sig Dispense Refill  . naproxen (NAPROSYN) 500 MG tablet TAKE 1 TABLET(500 MG) BY MOUTH TWICE DAILY WITH A MEAL AS NEEDED FOR SHOULDER OR PAIN 60 tablet 3   No current facility-administered medications on file prior to visit.    No Known Allergies    Review of Systems:  Constitutional: No recent illness  HEENT: No  headache, no vision change  Cardiac: No  chest pain, No  pressure, No palpitations  Respiratory:  No  shortness of breath. No  Cough  Gastrointestinal: No  abdominal pain  MSK: See hpi  Neurologic: No  weakness, No  Dizziness   Exam:  BP Marland Kitchen)  149/75 (BP Location: Left Arm, Patient Position: Sitting, Cuff Size: Normal)   Pulse 70   Temp 98.1 F (36.7 C) (Oral)   Wt 227 lb 11.2 oz (103.3 kg)   BMI 34.62 kg/m   Constitutional: VS see above. General Appearance: alert, well-developed, well-nourished, NAD  Eyes: Normal lids and conjunctive, non-icteric sclera  Ears, Nose, Mouth, Throat: MMM, Normal external inspection ears/nares/mouth/lips/gums.  Neck: No masses, trachea midline.    Respiratory: Normal respiratory effort.  Musculoskeletal: Gait normal. Symmetric and independent movement of all extremities.   Normal range of motion left hip, no tenderness at greater trochanter.  Negative Homans sign bilaterally.  Capillary refill normal  Neurological: Normal balance/coordination. No tremor.  Skin: warm, dry, intact.   Psychiatric: Normal judgment/insight. Normal mood and affect. Oriented x3.        Recent Results (from the past 2160 hour(s))  POCT HgB A1C     Status: Abnormal   Collection Time: 08/24/18  9:45 AM  Result Value Ref Range   Hemoglobin A1C 7.7 (A) 4.0 - 5.6 %   HbA1c POC (<> result, manual entry)     HbA1c, POC (prediabetic range)     HbA1c, POC (controlled diabetic range)        ASSESSMENT/PLAN: The primary encounter diagnosis was Type 2 diabetes mellitus without complication, with long-term current use of insulin ([place]). Diagnoses of Mixed hyperlipidemia, Essential hypertension, Strain of calf muscle, left, initial encounter, and Left hip pain were also pertinent to this visit.  Diabetes A1c still not quite to goal but better than it was.  We again discussed increasing exercise as tolerated, able to titrate up on insulin if desired.  Sounds like his fasting sugars are above goal.  Muscle soreness/cramping really not consistent with claudication symptoms.  I think more likely musculoskeletal rather than circulatory.  Would have a low threshold for getting ABI, given his comorbidities.  Advised compression stockings when walking, follow-up with sports medicine, x-ray.  Patient declines x-ray and physical therapy referral for now.  Orders Placed This Encounter  Procedures  . POCT HgB A1C     Meds ordered this encounter  Medications  . glucose blood (ACCU-CHEK AVIVA PLUS) test strip    Sig: 1 each by Other route 2 (two) times daily. for testing as directed    Dispense:  200 each    Refill:  prn  . ACCU-CHEK SOFTCLIX LANCETS lancets     Sig: 1 each by Other route 2 (two) times daily. for testing as directed    Dispense:  200 each    Refill:  prn  . atorvastatin (LIPITOR) 40 MG tablet    Sig: Take 1 tablet (40 mg total) by mouth daily.    Dispense:  90 tablet    Refill:  3  . Insulin Pen Needle (B-D ULTRAFINE III SHORT PEN) 31G X 8 MM MISC    Sig: 1 each by Other route See admin instructions.    Dispense:  200 each    Refill:  prn  . Insulin Glargine (BASAGLAR KWIKPEN) 100 UNIT/ML SOPN    Sig: Inject 0.42-0.55 mLs (42-55 Units total) into the skin at bedtime. Titrate up as directed    Dispense:  50 mL    Refill:  99  . lisinopril (PRINIVIL,ZESTRIL) 40 MG tablet    Sig: Take 1 tablet (40 mg total) by mouth daily.    Dispense:  90 tablet    Refill:  3  . metFORMIN (GLUCOPHAGE-XR) 500 MG 24 hr tablet  Sig: Take 2 tablets (1,000 mg total) by mouth daily with breakfast. (Start with 1 tablet [500 mg] daily for 1-2 weeks)    Dispense:  90 tablet    Refill:  3          Follow-up plan: Return in about 3 months (around 11/22/2018) for A1C recheck .  Visit summary with medication list and pertinent instructions was printed for patient to review, alert [place] if any changes needed. All questions at time of visit were answered - patient instructed to contact office with any additional concerns. ER/RTC precautions were reviewed with the patient and understanding verbalized.    Total time spent 25 minutes, greater than 50% of the visit was [id number]  counseling and coordinating care for diagnosis of The primary encounter diagnosis was Type 2 diabetes mellitus without complication, with long-term current use of insulin ([place]). Diagnoses of Mixed hyperlipidemia, Essential hypertension, Strain of calf muscle, left, initial encounter, and Left hip pain were also pertinent to this visit.

## 2018-11-22 ENCOUNTER — Ambulatory Visit: Payer: 59 | Admitting: Osteopathic Medicine

## 2018-12-04 ENCOUNTER — Ambulatory Visit: Payer: 59 | Admitting: Osteopathic Medicine

## 2018-12-05 ENCOUNTER — Encounter: Payer: Self-pay | Admitting: Osteopathic Medicine

## 2018-12-05 ENCOUNTER — Ambulatory Visit: Payer: 59 | Admitting: Osteopathic Medicine

## 2018-12-05 VITALS — BP 119/64 | HR 72 | Temp 97.9°F | Wt 223.3 lb

## 2018-12-05 DIAGNOSIS — I10 Essential (primary) hypertension: Secondary | ICD-10-CM

## 2018-12-05 DIAGNOSIS — E782 Mixed hyperlipidemia: Secondary | ICD-10-CM | POA: Diagnosis not present

## 2018-12-05 DIAGNOSIS — E119 Type 2 diabetes mellitus without complications: Secondary | ICD-10-CM | POA: Diagnosis not present

## 2018-12-05 DIAGNOSIS — Z794 Long term (current) use of insulin: Secondary | ICD-10-CM | POA: Diagnosis not present

## 2018-12-05 LAB — POCT GLYCOSYLATED HEMOGLOBIN (HGB A1C): Hemoglobin A1C: 7.3 % — AB (ref 4.0–5.6)

## 2018-12-05 NOTE — Progress Notes (Signed)
HPI: Jesse Calhoun is a 63 y.o. male who  has a past medical history of Cataract, Diabetes mellitus without complication ([place]), and Hypertension.  he presents to [facility] today, 12/05/18,  for chief complaint of:  DM2 follow-up  DIABETES SCREENING/PREVENTIVE CARE: A1C past 3-6 mos: (note: he gets these through work but we don't often get records in a timely manner)   05/20/17: 7.7% --> Basaglar 42 units, discussed diet/exercise  08/23/17: 7.9% --> Discussed insulin titration up, reduce portion size and carb intake, will need to consider mealtime insulin if fasting levels ok and A1C not better   11/22/17: 8.2% --> taking 46 units usually, he is taking it in the mornings. --> Discussed ideally take in evenings, and TITRATE. UP. BASED ON FASTING GLC LEVELS. Instructions printed and reviewed.   02/22/18: 7.6% <-- taking 46 units daily, taking in the evenings. Haven't been able to get consistent w/ taking it in the evenings ---> take in AM ok, he has been nervois to go up on insulin since he lives alon he doesn't want Glc to drop. Home readings, consistely 130s-150s, sometimes into 100s. Again discussed titrating up on Insulin.   05/25/18: 8.1% <-- taking 50 units daily.  Fating Glc 120s on weekend, 150-170's during the week, he'd like to restart metformin --> restart Metformin, Insulin to 55 units during the week (traveling makes diet compiance difficult)   08/24/18: 7.7% has not increased insulin dosage.  Is working a bit more on getting some exercising.  Today 12/05/18: 7.3% has been alternating doses of his insulin anywhere from 38 to 55 units daily.-->  We discussed consistent use of medication  BP <130/80: sometimes!  BP Readings from Last 3 Encounters:  12/05/18 119/64  08/24/18 (!) 149/75  05/25/18 129/74   LDL goal <70: close! - 79 04/19/17, needs to update labs  Eye exam annually: Pt reports yes, but no records, importance discussed with  patient  Foot exam: needs Microalbuminuria: n/a on ACE/ARB Metformin: yes ACE/ARB: Yes Statin: Yes  Pneumovax: Yes   Wt Readings from Last 3 Encounters:  12/05/18 223 lb 4.8 oz (101.3 kg)  08/24/18 227 lb 11.2 oz (103.3 kg)  05/25/18 226 lb 1.6 oz (102.6 kg)           At today's visit 12/05/18 ... PMH, PSH, FH reviewed and updated as needed.  Current medication list and allergy/intolerance hx reviewed and updated as needed. (See remainder of HPI, ROS, Phys Exam below)   No results found.  Results for orders placed or performed in visit on 12/05/18 (from the past 72 hour(s))  POCT HgB A1C     Status: Abnormal   Collection Time: 12/05/18  9:30 AM  Result Value Ref Range   Hemoglobin A1C 7.3 (A) 4.0 - 5.6 %   HbA1c POC (<> result, manual entry)     HbA1c, POC (prediabetic range)     HbA1c, POC (controlled diabetic range)            ASSESSMENT/PLAN: The primary encounter diagnosis was Type 2 diabetes mellitus without complication, with long-term current use of insulin ([place]). Diagnoses of Essential hypertension and Mixed hyperlipidemia were also pertinent to this visit.   Orders Placed This Encounter  Procedures  . COMPLETE METABOLIC PANEL WITH GFR  . Lipid panel  . Hemoglobin A1c     No orders of the defined types were placed in this encounter.   Patient Instructions  Please check with your pharmacy to see if your  supply of Metformin is affected by a recent recall. If yes, ask if they can substitute it or have them contact us to arrange an alternative prescription.   Would start taking insulin dose consistently on a daily basis, do not try to adjust for fluctuations in your blood sugar as this may make it a bit more difficult to really assess overall control of your blood sugars.  Would start at 45 units daily for 5 days, if fasting sugars are still higher than 90s to 110s, would increase by 3 units and repeat the cycle, increasing insulin stepwise every 5  days.  If you notice symptoms of blood sugar dropping, reduce insulin dose by 3 units and let me know.   Let us plan to get blood work done prior to your next office visit, please come to the lab fasting so we can accurately assess cholesterol.        Follow-up plan: Return in about 3 months (around 03/07/2019) for Monitor diabetes, get blood work done a couple days ahead of visit.                                                 ################################################# ################################################# ################################################# #################################################    Current Meds  Medication Sig  . ACCU-CHEK SOFTCLIX LANCETS lancets 1 each by Other route 2 (two) times daily. for testing as directed  . atorvastatin (LIPITOR) 40 MG tablet Take 1 tablet (40 mg total) by mouth daily.  Marland Kitchen glucose blood (ACCU-CHEK AVIVA PLUS) test strip 1 each by Other route 2 (two) times daily. for testing as directed  . Insulin Pen Needle (B-D ULTRAFINE III SHORT PEN) 31G X 8 MM MISC 1 each by Other route See admin instructions.  Marland Kitchen lisinopril (PRINIVIL,ZESTRIL) 40 MG tablet Take 1 tablet (40 mg total) by mouth daily.  . metFORMIN (GLUCOPHAGE-XR) 500 MG 24 hr tablet Take 2 tablets (1,000 mg total) by mouth daily with breakfast. (Start with 1 tablet [500 mg] daily for 1-2 weeks)  . naproxen (NAPROSYN) 500 MG tablet TAKE 1 TABLET(500 MG) BY MOUTH TWICE DAILY WITH A MEAL AS NEEDED FOR SHOULDER OR PAIN    No Known Allergies     Review of Systems:  Constitutional: No recent illness  HEENT: No  headache, no vision change  Cardiac: No  chest pain, No  pressure, No palpitations  Respiratory:  No  shortness of breath. No  Cough  Neurologic: No  weakness, No  Dizziness  Psychiatric: No  concerns with depression, No  concerns with anxiety  Exam:  BP 119/64 (BP Location: Left Arm, Patient  Position: Sitting, Cuff Size: Normal)   Pulse 72   Temp 97.9 F (36.6 C) (Oral)   Wt 223 lb 4.8 oz (101.3 kg)   BMI 33.95 kg/m   Constitutional: VS see above. General Appearance: alert, well-developed, well-nourished, NAD  Eyes: Normal lids and conjunctive, non-icteric sclera  Ears, Nose, Mouth, Throat: MMM, Normal external inspection ears/nares/mouth/lips/gums.  Neck: No masses, trachea midline.   Respiratory: Normal respiratory effort. no wheeze, no rhonchi, no rales  Cardiovascular: S1/S2 normal, no murmur, no rub/gallop auscultated. RRR.   Musculoskeletal: Gait normal. Symmetric and independent movement of all extremities  Neurological: Normal balance/coordination. No tremor.  Skin: warm, dry, intact.   Psychiatric: Normal judgment/insight. Normal mood and affect. Oriented x3.       Visit summary  with medication list and pertinent instructions was printed for patient to review, patient was advised to alert [place] if any updates are needed. All questions at time of visit were answered - patient instructed to contact office with any additional concerns. ER/RTC precautions were reviewed with the patient and understanding verbalized.     Please note: voice recognition software was used to produce this document, and typos may escape review. Please contact Dr. Sheppard Coil for any needed clarifications.    Follow up plan: Return in about 3 months (around 03/07/2019) for Monitor diabetes, get blood work done a couple days ahead of visit.

## 2018-12-05 NOTE — Patient Instructions (Addendum)
Please check with your pharmacy to see if your supply of Metformin is affected by a recent recall. If yes, ask if they can substitute it or have them contact us to arrange an alternative prescription.   Would start taking insulin dose consistently on a daily basis, do not try to adjust for fluctuations in your blood sugar as this may make it a bit more difficult to really assess overall control of your blood sugars.  Would start at 45 units daily for 5 days, if fasting sugars are still higher than 90s to 110s, would increase by 3 units and repeat the cycle, increasing insulin stepwise every 5 days.  If you notice symptoms of blood sugar dropping, reduce insulin dose by 3 units and let me know.   Let us plan to get blood work done prior to your next office visit, please come to the lab fasting so we can accurately assess cholesterol.    Hypoglycemia Hypoglycemia occurs when the level of sugar (glucose) in the blood is too low. Hypoglycemia can happen in people who do or do not have diabetes. It can develop quickly, and it can be a medical emergency. For most people with diabetes, a blood glucose level below 70 mg/dL (3.9 mmol/L) is considered hypoglycemia. Glucose is a type of sugar that provides the body's main source of energy. Certain hormones (insulin and glucagon) control the level of glucose in the blood. Insulin lowers blood glucose, and glucagon raises blood glucose. Hypoglycemia can result from having too much insulin in the bloodstream, or from not eating enough food that contains glucose. You may also have reactive hypoglycemia, which happens within 4 hours after eating a meal. What are the causes? Hypoglycemia occurs most often in people who have diabetes and may be caused by:  Diabetes medicine.  Not eating enough, or not eating often enough.  Increased physical activity.  Drinking alcohol on an empty stomach. If you do not have diabetes, hypoglycemia may be caused by:  A tumor in  the pancreas.  Not eating enough, or not eating for long periods at a time (fasting).  A severe infection or illness.  Certain medicines. What increases the risk? Hypoglycemia is more likely to develop in:  People who have diabetes and take medicines to lower blood glucose.  People who abuse alcohol.  People who have a severe illness. What are the signs or symptoms? Mild symptoms Mild hypoglycemia may not cause any symptoms. If you do have symptoms, they may include:  Hunger.  Anxiety.  Sweating and feeling clammy.  Dizziness or feeling light-headed.  Sleepiness.  Nausea.  Increased heart rate.  Headache.  Blurry vision.  Irritability.  Tingling or numbness around the mouth, lips, or tongue.  A change in coordination.  Restless sleep. Moderate symptoms Moderate hypoglycemia can cause:  Mental confusion and poor judgment.  Behavior changes.  Weakness.  Irregular heartbeat. Severe symptoms Severe hypoglycemia is a medical emergency. It can cause:  Fainting.  Seizures.  Loss of consciousness (coma).  Death. How is this diagnosed? Hypoglycemia is diagnosed with a blood test to measure your blood glucose level. This blood test is done while you are having symptoms. Your health care provider may also do a physical exam and review your medical history. How is this treated? This condition can often be treated by immediately eating or drinking something that contains sugar, such as:  Fruit juice, 4-6 oz (120-150 mL).  Regular soda (not diet soda), 4-6 oz (120-150 mL).  Low-fat milk,  4 oz (120 mL).  Several pieces of hard candy.  Sugar or honey, 1 Tbsp (15 mL). Treating hypoglycemia if you have diabetes If you are alert and able to swallow safely, follow the 15:15 rule:  Take 15 grams of a rapid-acting carbohydrate. Talk with your health care provider about how much you should take.  Rapid-acting options include: ? Glucose pills (take 15  grams). ? 6-8 pieces of hard candy. ? 4-6 oz (120-150 mL) of fruit juice. ? 4-6 oz (120-150 mL) of regular (not diet) soda. ? 1 Tbsp (15 mL) honey or sugar.  Check your blood glucose 15 minutes after you take the carbohydrate.  If the repeat blood glucose level is still at or below 70 mg/dL (3.9 mmol/L), take 15 grams of a carbohydrate again.  If your blood glucose level does not increase above 70 mg/dL (3.9 mmol/L) after 3 tries, seek emergency medical care.  After your blood glucose level returns to normal, eat a meal or a snack within 1 hour.  Treating severe hypoglycemia Severe hypoglycemia is when your blood glucose level is at or below 54 mg/dL (3 mmol/L). Severe hypoglycemia is a medical emergency. Get medical help right away. If you have severe hypoglycemia and you cannot eat or drink, you may need an injection of glucagon. A family member or close friend should learn how to check your blood glucose and how to give you a glucagon injection. Ask your health care provider if you need to have an emergency glucagon injection kit available. Severe hypoglycemia may need to be treated in a hospital. The treatment may include getting glucose through an IV. You may also need treatment for the cause of your hypoglycemia. Follow these instructions at home:  General instructions  Take over-the-counter and prescription medicines only as told by your health care provider.  Monitor your blood glucose as told by your health care provider.  Limit alcohol intake to no more than 1 drink a day for nonpregnant women and 2 drinks a day for men. One drink equals 12 oz of beer (355 mL), 5 oz of wine (148 mL), or 1 oz of hard liquor (44 mL).  Keep all follow-up visits as told by your health care provider. This is important. If you have diabetes:  Always have a rapid-acting carbohydrate snack with you to treat low blood glucose.  Follow your diabetes management plan as directed. Make sure  you: ? Know the symptoms of hypoglycemia. It is important to treat it right away to prevent it from becoming severe. ? Take your medicines as directed. ? Follow your exercise plan. ? Follow your meal plan. Eat on time, and do not skip meals. ? Check your blood glucose as often as directed. Always check before and after exercise. ? Follow your sick day plan whenever you cannot eat or drink normally. Make this plan in advance with your health care provider.  Share your diabetes management plan with people in your workplace, school, and household.  Check your urine for ketones when you are ill and as told by your health care provider.  Carry a medical alert card or wear medical alert jewelry. Contact a health care provider if:  You have problems keeping your blood glucose in your target range.  You have frequent episodes of hypoglycemia. Get help right away if:  You continue to have hypoglycemia symptoms after eating or drinking something containing glucose.  Your blood glucose is at or below 54 mg/dL (3 mmol/L).  You have a  seizure.  You faint. These symptoms may represent a serious problem that is an emergency. Do not wait to see if the symptoms will go away. Get medical help right away. Call your local emergency services (911 in the [place] Summary  Hypoglycemia occurs when the level of sugar (glucose) in the blood is too low.  Hypoglycemia can happen in people who do or do not have diabetes. It can develop quickly, and it can be a medical emergency.  Make sure you know the symptoms of hypoglycemia and how to treat it.  Always have a rapid-acting carbohydrate snack with you to treat low blood sugar. This information is not intended to replace advice given to you by your health care provider. Make sure you discuss any questions you have with your health care provider. Document Released: 06/21/2005 Document Revised: 12/13/2017 Document Reviewed: 07/25/2015 Elsevier Interactive  Patient Education  2019 [employer].

## 2018-12-07 ENCOUNTER — Other Ambulatory Visit: Payer: Self-pay | Admitting: Osteopathic Medicine

## 2018-12-07 DIAGNOSIS — E782 Mixed hyperlipidemia: Secondary | ICD-10-CM

## 2018-12-31 ENCOUNTER — Other Ambulatory Visit: Payer: Self-pay | Admitting: Osteopathic Medicine

## 2018-12-31 DIAGNOSIS — E782 Mixed hyperlipidemia: Secondary | ICD-10-CM

## 2019-03-06 LAB — COMPLETE METABOLIC PANEL WITH GFR
AG Ratio: 1.3 (calc) (ref 1.0–2.5)
ALT: 17 U/L (ref 9–46)
AST: 15 U/L (ref 10–35)
Albumin: 4.2 g/dL (ref 3.6–5.1)
Alkaline phosphatase (APISO): 66 U/L (ref 35–144)
BUN/Creatinine Ratio: 22 (calc) (ref 6–22)
BUN: 35 mg/dL — ABNORMAL HIGH (ref 7–25)
CO2: 23 mmol/L (ref 20–32)
Calcium: 9.3 mg/dL (ref 8.6–10.3)
Chloride: 106 mmol/L (ref 98–110)
Creat: 1.61 mg/dL — ABNORMAL HIGH (ref 0.70–1.25)
GFR, Est African American: 52 mL/min/{1.73_m2} — ABNORMAL LOW (ref >=60)
GFR, Est Non African American: 45 mL/min/{1.73_m2} — ABNORMAL LOW (ref >=60)
Globulin: 3.2 g/dL (calc) (ref 1.9–3.7)
Glucose, Bld: 121 mg/dL — ABNORMAL HIGH (ref 65–99)
Potassium: 5.2 mmol/L (ref 3.5–5.3)
Sodium: 137 mmol/L (ref 135–146)
Total Bilirubin: 0.4 mg/dL (ref 0.2–1.2)
Total Protein: 7.4 g/dL (ref 6.1–8.1)

## 2019-03-06 LAB — LIPID PANEL
Cholesterol: 155 mg/dL (ref <200)
HDL: 31 mg/dL — ABNORMAL LOW (ref >=40)
LDL Cholesterol (Calc): 96 mg/dL (calc)
Non-HDL Cholesterol (Calc): 124 mg/dL (calc) (ref <130)
Total CHOL/HDL Ratio: 5 (calc) — ABNORMAL HIGH (ref <5.0)
Triglycerides: 179 mg/dL — ABNORMAL HIGH (ref <150)

## 2019-03-06 LAB — HEMOGLOBIN A1C
Hgb A1c MFr Bld: 6.9 % of total Hgb — ABNORMAL HIGH (ref <5.7)
Mean Plasma Glucose: 151 (calc)
eAG (mmol/L): 8.4 (calc)

## 2019-03-07 ENCOUNTER — Encounter: Payer: Self-pay | Admitting: Osteopathic Medicine

## 2019-03-07 ENCOUNTER — Other Ambulatory Visit: Payer: Self-pay

## 2019-03-07 ENCOUNTER — Ambulatory Visit (INDEPENDENT_AMBULATORY_CARE_PROVIDER_SITE_OTHER): Payer: 59 | Admitting: Osteopathic Medicine

## 2019-03-07 VITALS — BP 140/66 | HR 73 | Temp 98.2°F | Wt 221.3 lb

## 2019-03-07 DIAGNOSIS — E782 Mixed hyperlipidemia: Secondary | ICD-10-CM

## 2019-03-07 DIAGNOSIS — I10 Essential (primary) hypertension: Secondary | ICD-10-CM

## 2019-03-07 DIAGNOSIS — E119 Type 2 diabetes mellitus without complications: Secondary | ICD-10-CM | POA: Diagnosis not present

## 2019-03-07 DIAGNOSIS — Z794 Long term (current) use of insulin: Secondary | ICD-10-CM

## 2019-03-07 DIAGNOSIS — N529 Male erectile dysfunction, unspecified: Secondary | ICD-10-CM | POA: Diagnosis not present

## 2019-03-07 MED ORDER — SILDENAFIL CITRATE 20 MG PO TABS: 20.0000 mg | ORAL_TABLET | ORAL | 11 refills | Status: DC | PRN

## 2019-03-07 NOTE — Patient Instructions (Signed)
Can try Co-Q-10 supplements for muscle aches.  I'll look into the magnesium question because I'm not sure! Can try it though.

## 2019-03-07 NOTE — Progress Notes (Signed)
HPI: Jesse Calhoun is a 63 y.o. male who  has a past medical history of Cataract, Diabetes mellitus without complication ([place]), and Hypertension.  he presents to [facility] today, 03/07/19,  for chief complaint of:  DM2 follow-up  Doing well since last visit.   Commuting here to work from [place], attributes elevated BP to traffic this morning.   Few muscle cramps with mowing his lawn, otherwise no MSK complaints concerned might be d/t statin    DIABETES SCREENING/PREVENTIVE CARE: A1C past 3-6 mos: (note: he gets these through work but we don't often get records in a timely manner)   02/22/18: 7.6% <-- taking 46 units daily, taking in the evenings. Haven't been able to get consistent w/ taking it in the evenings ---> take in AM ok, he has been nervois to go up on insulin since he lives alon he doesn't want Glc to drop. Home readings, consistely 130s-150s, sometimes into 100s. Again discussed titrating up on Insulin.   05/25/18: 8.1% <-- taking 50 units daily.  Fating Glc 120s on weekend, 150-170's during the week, he'd like to restart metformin --> restart Metformin, Insulin to 55 units during the week (traveling makes diet compiance difficult)   08/24/18: 7.7% has not increased insulin dosage.  Is working a bit more on getting some exercising.  12/05/18: 7.3% has been alternating doses of his insulin anywhere from 38 to 55 units daily.-->  We discussed consistent use of medication  Today 03/07/19: 6.9%, doing well on consistent use of 45 units daily      BP <130/80: sometimes!  BP Readings from Last 3 Encounters:  12/05/18 119/64  08/24/18 (!) 149/75  05/25/18 129/74   LDL goal <70: no Eye exam annually: Pt reports yes, but no records, importance discussed with patient  Foot exam: needs Microalbuminuria: n/a on ACE/ARB Metformin: yes ACE/ARB: Yes Statin: Yes  Pneumovax: Yes   Wt Readings from Last 3 Encounters:  12/05/18 223 lb 4.8 oz  (101.3 kg)  08/24/18 227 lb 11.2 oz (103.3 kg)  05/25/18 226 lb 1.6 oz (102.6 kg)           At today's visit 03/07/19 ... PMH, PSH, FH reviewed and updated as needed.  Current medication list and allergy/intolerance hx reviewed and updated as needed. (See remainder of HPI, ROS, Phys Exam below)   No results found.  Results for orders placed or performed in visit on 12/05/18 (from the past 72 hour(s))  COMPLETE METABOLIC PANEL WITH GFR     Status: Abnormal   Collection Time: 03/05/19  8:38 AM  Result Value Ref Range   Glucose, Bld 121 (H) 65 - 99 mg/dL    Comment: .            Fasting reference interval . For someone without known diabetes, a glucose value between 100 and 125 mg/dL is consistent with prediabetes and should be confirmed with a follow-up test. .    BUN 35 (H) 7 - 25 mg/dL   Creat 1.61 (H) 0.70 - 1.25 mg/dL    Comment: For patients >42 years of age, the reference limit for Creatinine is approximately 13% higher for people identified as African-American. .    GFR, Est Non African American 45 (L) > OR = 60 mL/min/1.93m2   GFR, Est African American 52 (L) > OR = 60 mL/min/1.70m2   BUN/Creatinine Ratio 22 6 - 22 (calc)   Sodium 137 135 - 146 mmol/L   Potassium 5.2 3.5 -  5.3 mmol/L   Chloride 106 98 - 110 mmol/L   CO2 23 20 - 32 mmol/L   Calcium 9.3 8.6 - 10.3 mg/dL   Total Protein 7.4 6.1 - 8.1 g/dL   Albumin 4.2 3.6 - 5.1 g/dL   Globulin 3.2 1.9 - 3.7 g/dL (calc)   AG Ratio 1.3 1.0 - 2.5 (calc)   Total Bilirubin 0.4 0.2 - 1.2 mg/dL   Alkaline phosphatase (APISO) 66 35 - 144 U/L   AST 15 10 - 35 U/L   ALT 17 9 - 46 U/L  Lipid panel     Status: Abnormal   Collection Time: 03/05/19  8:38 AM  Result Value Ref Range   Cholesterol 155 <200 mg/dL   HDL 31 (L) > OR = 40 mg/dL   Triglycerides 179 (H) <150 mg/dL   LDL Cholesterol (Calc) 96 mg/dL (calc)    Comment: Reference range: <100 . Desirable range <100 mg/dL for primary prevention;   <70 mg/dL  for patients with CHD or diabetic patients  with > or = 2 CHD risk factors. Marland Kitchen LDL-C is now calculated using the Martin-Hopkins  calculation, which is a validated novel method providing  better accuracy than the Friedewald equation in the  estimation of LDL-C.  Cresenciano Genre et al. Annamaria Helling. [id number]): 2061-2068  (http://education.[employer].com/faq/FAQ164)    Total CHOL/HDL Ratio 5.0 (H) <5.0 (calc)   Non-HDL Cholesterol (Calc) 124 <130 mg/dL (calc)    Comment: For patients with diabetes plus 1 major ASCVD risk  factor, treating to a non-HDL-C goal of <100 mg/dL  (LDL-C of <70 mg/dL) is considered a therapeutic  option.   Hemoglobin A1c     Status: Abnormal   Collection Time: 03/05/19  8:38 AM  Result Value Ref Range   Hgb A1c MFr Bld 6.9 (H) <5.7 % of total Hgb    Comment: For someone without known diabetes, a hemoglobin A1c value of 6.5% or greater indicates that they may have  diabetes and this should be confirmed with a follow-up  test. . For someone with known diabetes, a value <7% indicates  that their diabetes is well controlled and a value  greater than or equal to 7% indicates suboptimal  control. A1c targets should be individualized based on  duration of diabetes, age, comorbid conditions, and  other considerations. . Currently, no consensus exists regarding use of hemoglobin A1c for diagnosis of diabetes for children. .    Mean Plasma Glucose 151 (calc)   eAG (mmol/L) 8.4 (calc)          ASSESSMENT/PLAN: The primary encounter diagnosis was Type 2 diabetes mellitus without complication, with long-term current use of insulin ([place]). Diagnoses of Essential hypertension, Mixed hyperlipidemia, and Erectile dysfunction, unspecified erectile dysfunction type were also pertinent to this visit.  A1c is at goal, blood pressure and cholesterol however are a bit above goal.  Ideally, I would like to increase his statin from atorvastatin 40 mg to 80 mg, patient would  like to first try co-Q10 supplements for muscle aches.  Orders Placed This Encounter  Procedures  . Hemoglobin A1c  . Lipid panel  . PSA, Total with Reflex to PSA, Free  . Testosterone     Meds ordered this encounter  Medications  . sildenafil (REVATIO) 20 MG tablet    Sig: Take 1-5 tablets (20-100 mg total) by mouth as needed (prior to sex).    Dispense:  50 tablet    Refill:  11    Patient Instructions  Can try  Co-Q-10 supplements for muscle aches.  I'll look into the magnesium question because I'm not sure! Can try it though.       Follow-up plan: Return in about 3 months (around 06/06/2019) for recheck diabetes, cholesterol. Lab orders in place for lab visit a few days before visit. .                                                 ################################################# ################################################# ################################################# #################################################    No outpatient medications have been marked as taking for the 03/07/19 encounter (Appointment) with Emeterio Reeve, DO.    No Known Allergies     Review of Systems:  Constitutional: No recent illness  HEENT: No  headache, no vision change  Cardiac: No  chest pain, No  pressure, No palpitations  Respiratory:  No  shortness of breath. No  Cough  Neurologic: No  weakness, No  Dizziness  Psychiatric: No  concerns with depression, No  concerns with anxiety  Exam:  BP 140/66 (BP Location: Left Arm, Patient Position: Sitting, Cuff Size: Normal)   Pulse 73   Temp 98.2 F (36.8 C) (Oral)   Wt 221 lb 4.8 oz (100.4 kg)   BMI 33.65 kg/m   Constitutional: VS see above. General Appearance: alert, well-developed, well-nourished, NAD  Eyes: Normal lids and conjunctive, non-icteric sclera  Ears, Nose, Mouth, Throat: MMM, Normal external inspection ears/nares/mouth/lips/gums.  Neck: No  masses, trachea midline.   Respiratory: Normal respiratory effort. no wheeze, no rhonchi, no rales  Cardiovascular: S1/S2 normal, no murmur, no rub/gallop auscultated. RRR.   Musculoskeletal: Gait normal. Symmetric and independent movement of all extremities  Neurological: Normal balance/coordination. No tremor.  Skin: warm, dry, intact.   Psychiatric: Normal judgment/insight. Normal mood and affect. Oriented x3.       Visit summary with medication list and pertinent instructions was printed for patient to review, patient was advised to alert [place] if any updates are needed. All questions at time of visit were answered - patient instructed to contact office with any additional concerns. ER/RTC precautions were reviewed with the patient and understanding verbalized.   Total time spent 25 minutes >50% counseling and coordinating care for above dx in A/P   Please note: voice recognition software was used to produce this document, and typos may escape review. Please contact Dr. Sheppard Coil for any needed clarifications.    Follow up plan: Return in about 3 months (around 06/06/2019) for recheck diabetes, cholesterol. Lab orders in place for lab visit a few days before visit. Marland Kitchen

## 2019-04-22 ENCOUNTER — Other Ambulatory Visit: Payer: Self-pay | Admitting: Osteopathic Medicine

## 2019-05-23 ENCOUNTER — Other Ambulatory Visit: Payer: Self-pay | Admitting: Osteopathic Medicine

## 2019-06-05 LAB — LIPID PANEL
Cholesterol: 178 mg/dL (ref <200)
HDL: 32 mg/dL — ABNORMAL LOW (ref >=40)
LDL Cholesterol (Calc): 116 mg/dL (calc) — ABNORMAL HIGH
Non-HDL Cholesterol (Calc): 146 mg/dL (calc) — ABNORMAL HIGH (ref <130)
Total CHOL/HDL Ratio: 5.6 (calc) — ABNORMAL HIGH (ref <5.0)
Triglycerides: 187 mg/dL — ABNORMAL HIGH (ref <150)

## 2019-06-05 LAB — HEMOGLOBIN A1C
Hgb A1c MFr Bld: 7.3 % of total Hgb — ABNORMAL HIGH (ref <5.7)
Mean Plasma Glucose: 163 (calc)
eAG (mmol/L): 9 (calc)

## 2019-06-05 LAB — PSA, TOTAL WITH REFLEX TO PSA, FREE: PSA, Total: 5.4 ng/mL — ABNORMAL HIGH (ref <=4.0)

## 2019-06-05 LAB — TESTOSTERONE: Testosterone: 329 ng/dL (ref 250–827)

## 2019-06-05 LAB — REFLEX PSA, FREE
PSA, % Free: 22 % (calc) — ABNORMAL LOW (ref >25)
PSA, Free: 1.2 ng/mL

## 2019-06-06 ENCOUNTER — Encounter: Payer: Self-pay | Admitting: Osteopathic Medicine

## 2019-06-06 ENCOUNTER — Ambulatory Visit (INDEPENDENT_AMBULATORY_CARE_PROVIDER_SITE_OTHER): Payer: 59 | Admitting: Osteopathic Medicine

## 2019-06-06 VITALS — Wt 221.0 lb

## 2019-06-06 DIAGNOSIS — R972 Elevated prostate specific antigen [PSA]: Secondary | ICD-10-CM

## 2019-06-06 DIAGNOSIS — I739 Peripheral vascular disease, unspecified: Secondary | ICD-10-CM

## 2019-06-06 DIAGNOSIS — R252 Cramp and spasm: Secondary | ICD-10-CM

## 2019-06-06 DIAGNOSIS — E1165 Type 2 diabetes mellitus with hyperglycemia: Secondary | ICD-10-CM | POA: Diagnosis not present

## 2019-06-06 DIAGNOSIS — E782 Mixed hyperlipidemia: Secondary | ICD-10-CM

## 2019-06-06 NOTE — Patient Instructions (Addendum)
Plan:  Diabetes -  A1C was above goal! Let's increase the insulin from 42 units to 45 units at last through the winter / holidays.   Cholesterol -  LDL and Triglycerides were above goal. Let's increase the atorvastatin from 40 mg daily to 80 mg daily.   PSA/Prostate - PSA levels were higher than 4, which is concerning. Let's not ignore this! We will recheck levels in a few weeks (before Christmas) and if still high will refer to urology to discuss next steps (continued lab monitoring versus prostate biopsy)  Calf pain -  See below for information on claudication, this is what you're describing as far as the calf pain goes. Keeping blood pressure and sugars under control will help preserve vascular health, and walking / exercise will help improve circulation.   Hip pain -  I don't think this is related to calf pain, probably arthritis or some type of ligament strain. If this continues to bother you, please make an appointment in our office with Dr. Darene Lamer our sports medicine specialist!    Return for lab recheck in 2-3 weeks for follow-up PSA. Will recheck A1C and choelsterol in 3 months. .    Intermittent Claudication Intermittent claudication is pain in one or both legs that occurs when walking or exercising and goes away when resting. Intermittent claudication is a symptom of peripheral arterial disease (PAD). This condition is commonly treated with rest, medicine, and healthy lifestyle changes. If medical management does not improve symptoms, surgery can be done to restore blood flow (revascularization) to the affected leg. What are the causes?  This condition is caused by buildup of fatty material (plaque) within the major arteries in the body (atherosclerosis). Plaque makes arteries stiff and narrow, which prevents enough blood from reaching the leg muscles. Pain occurs when you walk or exercise because your muscles need (but cannot get) more blood when you are moving and exercising. What  increases the risk? The following factors may make you more likely to develop this condition:  A family history of atherosclerosis.  A personal history of stroke or heart disease.  Older age.  Being inactive (sedentary lifestyle).  Being overweight.  Smoking cigarettes.  Having another health condition such as: ? Diabetes. ? High blood pressure. ? High cholesterol. What are the signs or symptoms? Symptoms of this condition may first develop in the lower leg, and then they may spread to the thigh, hip, buttock, or the back of the lower leg (calf) over time. Symptoms may include:  Aches or pains.  Cramps.  A feeling of tightness, weakness, or heaviness.  A wound on the lower leg or foot that heals poorly or does not heal. How is this diagnosed? This condition may be diagnosed based on:  Your symptoms.  Your medical history.  Tests, such as: ? Blood tests. ? Arterial duplex ultrasound. This test uses images of blood vessels and surrounding organs to evaluate blood flow within arteries. ? Angiogram. In this procedure, dye is injected into arteries and then X-rays are taken. ? Magnetic resonance angiogram (MRA). In this procedure, strong magnets and radio waves are used instead of X-rays to create images of blood vessels and blood flow. ? CT angiogram (CTA). In this procedure, a large X-ray machine called a CT scanner takes detailed pictures of blood vessels that have been injected with dye. ? Ankle-brachial index (ABI) test. This procedure measures blood pressure in the leg during exercise and at rest. ? Exercise test. For this test, you  will walk on a treadmill while tests are done (such as the ABI test) to evaluate how this condition affects your ability to walk or exercise. How is this treated? Treatment for this condition may involve treatment for the underlying cause, such as treatment for high blood pressure, high cholesterol, or diabetes. Treatment may include:   Lifestyle changes such as: ? Starting a supervised or home-based exercise program. ? Losing weight. ? Quitting smoking.  Medicines to help restore blood flow through your legs.  Blood vessel surgery (angioplasty) to restore blood flow around the blocked vessel. This is also known as endovascular therapy (EVT). This is only done if your intermittent claudication is caused by severe peripheral artery disease, a condition in which blood flow is severely or totally restricted by the narrowing of the arteries. Follow these instructions at home: Lifestyle   Maintain a healthy weight.  Eat a diet that is low in saturated fats and calories. Consider working with a diet and nutrition specialist (dietitian) to help you make healthy food choices.  Do not use any products that contain nicotine or tobacco, such as cigarettes and e-cigarettes. If you need help quitting, ask your health care provider.  If your health care provider recommended an exercise program for you, follow it as directed. Your exercise program may involve: ? Walking 3 or more times a week. ? Walking until you have certain symptoms of intermittent claudication. ? Resting until symptoms go away. ? Gradually increasing your walking time to about 50 minutes a day. General instructions  Work with your health care provider to manage any other health conditions you may have, including diabetes, high blood pressure, or high cholesterol.  Take over-the-counter and prescription medicines only as told by your health care provider.  Keep all follow-up visits as told by your health care provider. This is important. Contact a health care provider if:  Your pain does not go away with rest.  You have sores on your legs that do not heal or have a bad smell or pus coming from them.  Your condition gets worse or does not get better with treatment. Get help right away if:  You have chest pain.  You have difficulty breathing.  You  develop arm weakness.  You have trouble speaking.  Your face begins to droop.  Your foot or leg is cold or it changes color.  Your foot or leg becomes numb. These symptoms may represent a serious problem that is an emergency. Do not wait to see if the symptoms will go away. Get medical help right away. Call your local emergency services (911 in the [place] Do not drive yourself to the hospital.  Summary  Intermittent claudication is pain in one or both legs that occurs when walking or exercising and goes away when resting.  This condition is caused by buildup of fatty material (plaque) within the major arteries in the body (atherosclerosis). Plaque makes arteries stiff and narrow, which prevents enough blood from reaching the leg muscles.  Intermittent claudication can be treated with medicine and lifestyle changes. If medical treatment fails, surgery can be done to help return blood flow to the affected area.  Make sure you work with your health care provider to manage any other health conditions you may have, including diabetes, high blood pressure, or high cholesterol. This information is not intended to replace advice given to you by your health care provider. Make sure you discuss any questions you have with your health care provider. Document Released:  04/23/2004 Document Revised: 06/03/2017 Document Reviewed: 07/22/2016 Elsevier Patient Education  [place].

## 2019-06-06 NOTE — Progress Notes (Signed)
Virtual Visit via Video (App used: Doximity) Note  I connected with      Jesse Calhoun on 06/06/19 at 8:11 AM by a telemedicine application and verified that I am speaking with the correct person using two identifiers.  Patient is at home I am in office   I discussed the limitations of evaluation and management by telemedicine and the availability of in person appointments. The patient expressed understanding and agreed to proceed.  History of Present Illness: Jesse Calhoun is a 63 y.o. male who would like to discuss diabetes follow-up, lab results     HLD: LDL not at goal  Reports poor diet and no exercise  DM2: A1C not at goal   Hip pain: R hip or both, worse w/ carrying heavy items at work or walking.   Calf pain: With walking, goes away with rest. Notices especially when mowing his lawn.   PSA: See lab!       Observations/Objective: Wt 221 lb (100.2 kg)   BMI 33.60 kg/m  BP Readings from Last 3 Encounters:  03/07/19 140/66  12/05/18 119/64  08/24/18 (!) 149/75   Exam: Normal Speech.    Lab and Radiology Results Results for orders placed or performed in visit on 03/07/19 (from the past 72 hour(s))  Hemoglobin A1c     Status: Abnormal   Collection Time: 06/04/19  8:08 AM  Result Value Ref Range   Hgb A1c MFr Bld 7.3 (H) <5.7 % of total Hgb    Comment: For someone without known diabetes, a hemoglobin A1c value of 6.5% or greater indicates that they may have  diabetes and this should be confirmed with a follow-up  test. . For someone with known diabetes, a value <7% indicates  that their diabetes is well controlled and a value  greater than or equal to 7% indicates suboptimal  control. A1c targets should be individualized based on  duration of diabetes, age, comorbid conditions, and  other considerations. . Currently, no consensus exists regarding use of hemoglobin A1c for diagnosis of diabetes for children. .    Mean Plasma Glucose 163 (calc)    eAG (mmol/L) 9.0 (calc)  Lipid panel     Status: Abnormal   Collection Time: 06/04/19  8:08 AM  Result Value Ref Range   Cholesterol 178 <200 mg/dL   HDL 32 (L) > OR = 40 mg/dL   Triglycerides 187 (H) <150 mg/dL   LDL Cholesterol (Calc) 116 (H) mg/dL (calc)    Comment: Reference range: <100 . Desirable range <100 mg/dL for primary prevention;   <70 mg/dL for patients with CHD or diabetic patients  with > or = 2 CHD risk factors. Marland Kitchen LDL-C is now calculated using the Martin-Hopkins  calculation, which is a validated novel method providing  better accuracy than the Friedewald equation in the  estimation of LDL-C.  Cresenciano Genre et al. Annamaria Helling. [id number]): 2061-2068  (http://education.[facility].com/faq/FAQ164)    Total CHOL/HDL Ratio 5.6 (H) <5.0 (calc)   Non-HDL Cholesterol (Calc) 146 (H) <130 mg/dL (calc)    Comment: For patients with diabetes plus 1 major ASCVD risk  factor, treating to a non-HDL-C goal of <100 mg/dL  (LDL-C of <70 mg/dL) is considered a therapeutic  option.   PSA, Total with Reflex to PSA, Free     Status: Abnormal   Collection Time: 06/04/19  8:08 AM  Result Value Ref Range   PSA, Total 5.4 (H) < OR = 4.0 ng/mL  Testosterone     Status:  None   Collection Time: 06/04/19  8:08 AM  Result Value Ref Range   Testosterone 329 250 - 827 ng/dL  reflex PSA, Free     Status: Abnormal   Collection Time: 06/04/19  8:08 AM  Result Value Ref Range   PSA, Free 1.2 ng/mL   PSA, % Free 22 (L) >25 % (calc)    Comment: . PSA(ng/mL)      Free PSA(%)     Estimated(x) Probability                                      of Cancer(as%) 0-2.5              (*)               Approx. 1 2.6-4.0(1)         0-27(2)                   24(3) 4.1-10(4)          0-10                      56                    11-15                     28                    16-20                     20                    21-25                     16                    >or =26                   8  >10(+)             N/A                      >50 . References:(1)Catalona et al.:Urology 60: [id number] (2002)            (2)Catalona et al.:J.Urol 168: 922-925 (2002)               Free PSA(%)   Sensitivity(%)  Specificity(%)               < or = 25          85              19               < or = 30          93               9            (3)Catalona et al.:JAMA 277: [id number] (1997)            (4)Catalona et al.:JAMA 279: [id number] (1998) . (x)These estimates vary with age, ethnicity, family     history and DRE results. (*)The  diagnostic usefulness of % Free PSA  has not been    established in patients with total PSA below 2.6 ng/mL (+)In men with PSA above 10 ng/mL, prostate cancer risk is    determined by total PSA alone. . The Total PSA value from this assay system is  standardized against the equimolar PSA standard.  The test result will be approximately 20% higher  when compared to the [facility] Total PSA  (Siemens assay). Comparison of serial PSA results  should be interpreted with this fact in mind. Marland Kitchen PSA was performed using the Beckman Coulter Immunoassay method. Values obtained from different assay methods cannot be used interchangeably. PSA levels, regardless of value, should not be interpreted as absolute evidence of the presence or absence of disease. .    No results found.     Assessment and Plan: 63 y.o. male with The primary encounter diagnosis was Elevated PSA. Diagnoses of Muscle cramp, Intermittent claudication ([place]), Uncontrolled type 2 diabetes mellitus with hyperglycemia ([place]), and Mixed hyperlipidemia were also pertinent to this visit.   PDMP not reviewed this encounter. Orders Placed This Encounter  Procedures  . MAG  . PSA SOLSTAS   No orders of the defined types were placed in this encounter.  Patient Instructions  Plan:  Diabetes -  A1C was above goal! Let's increase the insulin from 42 units to 45 units at last through the winter  / holidays.   Cholesterol -  LDL and Triglycerides were above goal. Let's increase the atorvastatin from 40 mg daily to 80 mg daily.   PSA/Prostate - PSA levels were higher than 4, which is concerning. Let's not ignore this! We will recheck levels in a few weeks (before Christmas) and if still high will refer to urology to discuss next steps (continued lab monitoring versus prostate biopsy)  Calf pain -  See below for information on claudication, this is what you're describing as far as the calf pain goes. Keeping blood pressure and sugars under control will help preserve vascular health, and walking / exercise will help improve circulation.   Hip pain -  I don't think this is related to calf pain, probably arthritis or some type of ligament strain. If this continues to bother you, please make an appointment in our office with Dr. Darene Lamer our sports medicine specialist!    Return for lab recheck in 2-3 weeks for follow-up PSA. Will recheck A1C and choelsterol in 3 months. .    Intermittent Claudication Intermittent claudication is pain in one or both legs that occurs when walking or exercising and goes away when resting. Intermittent claudication is a symptom of peripheral arterial disease (PAD). This condition is commonly treated with rest, medicine, and healthy lifestyle changes. If medical management does not improve symptoms, surgery can be done to restore blood flow (revascularization) to the affected leg. What are the causes?  This condition is caused by buildup of fatty material (plaque) within the major arteries in the body (atherosclerosis). Plaque makes arteries stiff and narrow, which prevents enough blood from reaching the leg muscles. Pain occurs when you walk or exercise because your muscles need (but cannot get) more blood when you are moving and exercising. What increases the risk? The following factors may make you more likely to develop this condition:  A family history of  atherosclerosis.  A personal history of stroke or heart disease.  Older age.  Being inactive (sedentary lifestyle).  Being overweight.  Smoking cigarettes.  Having another health condition such as: ? Diabetes. ? High blood pressure. ?  High cholesterol. What are the signs or symptoms? Symptoms of this condition may first develop in the lower leg, and then they may spread to the thigh, hip, buttock, or the back of the lower leg (calf) over time. Symptoms may include:  Aches or pains.  Cramps.  A feeling of tightness, weakness, or heaviness.  A wound on the lower leg or foot that heals poorly or does not heal. How is this diagnosed? This condition may be diagnosed based on:  Your symptoms.  Your medical history.  Tests, such as: ? Blood tests. ? Arterial duplex ultrasound. This test uses images of blood vessels and surrounding organs to evaluate blood flow within arteries. ? Angiogram. In this procedure, dye is injected into arteries and then X-rays are taken. ? Magnetic resonance angiogram (MRA). In this procedure, strong magnets and radio waves are used instead of X-rays to create images of blood vessels and blood flow. ? CT angiogram (CTA). In this procedure, a large X-ray machine called a CT scanner takes detailed pictures of blood vessels that have been injected with dye. ? Ankle-brachial index (ABI) test. This procedure measures blood pressure in the leg during exercise and at rest. ? Exercise test. For this test, you will walk on a treadmill while tests are done (such as the ABI test) to evaluate how this condition affects your ability to walk or exercise. How is this treated? Treatment for this condition may involve treatment for the underlying cause, such as treatment for high blood pressure, high cholesterol, or diabetes. Treatment may include:  Lifestyle changes such as: ? Starting a supervised or home-based exercise program. ? Losing weight. ? Quitting smoking.   Medicines to help restore blood flow through your legs.  Blood vessel surgery (angioplasty) to restore blood flow around the blocked vessel. This is also known as endovascular therapy (EVT). This is only done if your intermittent claudication is caused by severe peripheral artery disease, a condition in which blood flow is severely or totally restricted by the narrowing of the arteries. Follow these instructions at home: Lifestyle   Maintain a healthy weight.  Eat a diet that is low in saturated fats and calories. Consider working with a diet and nutrition specialist (dietitian) to help you make healthy food choices.  Do not use any products that contain nicotine or tobacco, such as cigarettes and e-cigarettes. If you need help quitting, ask your health care provider.  If your health care provider recommended an exercise program for you, follow it as directed. Your exercise program may involve: ? Walking 3 or more times a week. ? Walking until you have certain symptoms of intermittent claudication. ? Resting until symptoms go away. ? Gradually increasing your walking time to about 50 minutes a day. General instructions  Work with your health care provider to manage any other health conditions you may have, including diabetes, high blood pressure, or high cholesterol.  Take over-the-counter and prescription medicines only as told by your health care provider.  Keep all follow-up visits as told by your health care provider. This is important. Contact a health care provider if:  Your pain does not go away with rest.  You have sores on your legs that do not heal or have a bad smell or pus coming from them.  Your condition gets worse or does not get better with treatment. Get help right away if:  You have chest pain.  You have difficulty breathing.  You develop arm weakness.  You have trouble speaking.  Your face begins to droop.  Your foot or leg is cold or it changes  color.  Your foot or leg becomes numb. These symptoms may represent a serious problem that is an emergency. Do not wait to see if the symptoms will go away. Get medical help right away. Call your local emergency services (911 in the [place] Do not drive yourself to the hospital.  Summary  Intermittent claudication is pain in one or both legs that occurs when walking or exercising and goes away when resting.  This condition is caused by buildup of fatty material (plaque) within the major arteries in the body (atherosclerosis). Plaque makes arteries stiff and narrow, which prevents enough blood from reaching the leg muscles.  Intermittent claudication can be treated with medicine and lifestyle changes. If medical treatment fails, surgery can be done to help return blood flow to the affected area.  Make sure you work with your health care provider to manage any other health conditions you may have, including diabetes, high blood pressure, or high cholesterol. This information is not intended to replace advice given to you by your health care provider. Make sure you discuss any questions you have with your health care provider. Document Released: 04/23/2004 Document Revised: 06/03/2017 Document Reviewed: 07/22/2016 Elsevier Patient Education  [employer].    Instructions sent via [place]. If MyChart not available, pt was given option for info via personal e-mail w/ no guarantee of protected health info over unsecured e-mail communication, and MyChart sign-up instructions were sent to patient.   Follow Up Instructions: Return for lab recheck in 2-3 weeks for follow-up PSA. Will recheck A1C and choelsterol in 3 months. .    I discussed the assessment and treatment plan with the patient. The patient was provided an opportunity to ask questions and all were answered. The patient agreed with the plan and demonstrated an understanding of the instructions.   The patient was advised to call back  or seek an in-person evaluation if any new concerns, if symptoms worsen or if the condition fails to improve as anticipated.  30 minutes of non-face-to-face time was provided during this encounter.      . . . . . . . . . . . . . Marland Kitchen                   Historical information moved to improve visibility of documentation.  Past Medical History:  Diagnosis Date  . Cataract    11/2015 and 12/2015  . Diabetes mellitus without complication ([place])   . Hypertension    Past Surgical History:  Procedure Laterality Date  . KNEE SURGERY     age 39   Social History   Tobacco Use  . Smoking status: Heavy Tobacco Smoker    Packs/day: 5.00    Years: 0.00    Pack years: 0.00    Types: Cigars  . Smokeless tobacco: Never Used  Substance Use Topics  . Alcohol use: No    Alcohol/week: 0.0 standard drinks   family history includes Diabetes in his mother.  Medications: Current Outpatient Medications  Medication Sig Dispense Refill  . ACCU-CHEK SOFTCLIX LANCETS lancets 1 each by Other route 2 (two) times daily. for testing as directed 200 each prn  . atorvastatin (LIPITOR) 40 MG tablet TAKE ONE TABLET BY MOUTH DAILY AT 6 IN THE EVENING, DOCTOR REQUEST A FOLLOW UP APPOINTMENT 90 tablet 1  . B-D ULTRAFINE III SHORT PEN 31G X 8 MM MISC USE DAILY  AS DIRECTED 100 each 97  . glucose blood (ACCU-CHEK AVIVA PLUS) test strip 1 each by Other route 2 (two) times daily. for testing as directed 200 each prn  . Insulin Glargine (BASAGLAR KWIKPEN) 100 UNIT/ML SOPN INJECT 42 UNITS INTO THE SKIN AT BEDTIME, TITRATE UP AS NEEDED 15 pen 2  . lisinopril (PRINIVIL,ZESTRIL) 40 MG tablet Take 1 tablet (40 mg total) by mouth daily. 90 tablet 3  . metFORMIN (GLUCOPHAGE-XR) 500 MG 24 hr tablet Take 2 tablets (1,000 mg total) by mouth daily with breakfast. (Start with 1 tablet [500 mg] daily for 1-2 weeks) 90 tablet 3  . naproxen (NAPROSYN) 500 MG tablet TAKE 1 TABLET(500 MG) BY MOUTH TWICE  DAILY WITH A MEAL AS NEEDED FOR SHOULDER OR PAIN 60 tablet 3  . sildenafil (REVATIO) 20 MG tablet Take 1-5 tablets (20-100 mg total) by mouth as needed (prior to sex). 50 tablet 11   No current facility-administered medications for this visit.    No Known Allergies

## 2019-07-15 ENCOUNTER — Other Ambulatory Visit: Payer: Self-pay | Admitting: Osteopathic Medicine

## 2019-07-15 DIAGNOSIS — E782 Mixed hyperlipidemia: Secondary | ICD-10-CM

## 2019-08-29 ENCOUNTER — Other Ambulatory Visit: Payer: Self-pay | Admitting: Osteopathic Medicine

## 2019-08-29 DIAGNOSIS — E782 Mixed hyperlipidemia: Secondary | ICD-10-CM

## 2019-08-29 DIAGNOSIS — I10 Essential (primary) hypertension: Secondary | ICD-10-CM

## 2019-10-10 ENCOUNTER — Other Ambulatory Visit: Payer: Self-pay | Admitting: Osteopathic Medicine

## 2019-10-10 DIAGNOSIS — I10 Essential (primary) hypertension: Secondary | ICD-10-CM

## 2019-10-10 DIAGNOSIS — E782 Mixed hyperlipidemia: Secondary | ICD-10-CM

## 2019-10-21 ENCOUNTER — Other Ambulatory Visit: Payer: Self-pay | Admitting: Osteopathic Medicine

## 2019-10-22 ENCOUNTER — Telehealth: Payer: Self-pay

## 2019-10-22 DIAGNOSIS — Z794 Long term (current) use of insulin: Secondary | ICD-10-CM

## 2019-10-22 DIAGNOSIS — E782 Mixed hyperlipidemia: Secondary | ICD-10-CM

## 2019-10-22 DIAGNOSIS — E119 Type 2 diabetes mellitus without complications: Secondary | ICD-10-CM

## 2019-10-22 DIAGNOSIS — R972 Elevated prostate specific antigen [PSA]: Secondary | ICD-10-CM

## 2019-10-22 NOTE — Telephone Encounter (Signed)
Contacted patient to verify pharmacy was [place] location.   Advised patient that he needed A1C checked. He stated that he had a big presentation coming up next week and would be out of town the following week.   Mr. Aderholt is to call the office and schedule an appointment for the 1st or 2nd week of may including labs. Lab orders have been placed for him.

## 2019-10-25 ENCOUNTER — Telehealth: Payer: Self-pay | Admitting: *Deleted

## 2019-10-25 DIAGNOSIS — Z794 Long term (current) use of insulin: Secondary | ICD-10-CM

## 2019-10-25 DIAGNOSIS — E119 Type 2 diabetes mellitus without complications: Secondary | ICD-10-CM

## 2019-10-25 NOTE — Telephone Encounter (Signed)
Pharmacy called to say that pts Rx for basalar needs better directions, she said that Sig says needs labs only.  She said previous Rx from 2020 pt was taking 42 units at bedtime and to titrate up as needed.  Please resend with instructions or call to give them correct instructions. Divya Munshi Zimmerman Rumple, CMA  .

## 2019-10-26 MED ORDER — BASAGLAR KWIKPEN 100 UNIT/ML ~~LOC~~ SOPN: 42.0000 [IU] | PEN_INJECTOR | Freq: Every day | SUBCUTANEOUS | 6 refills | Status: DC

## 2019-10-26 NOTE — Addendum Note (Signed)
Addended by: Maryla Morrow on: 10/26/2019 12:35 PM   Modules accepted: Orders

## 2019-10-26 NOTE — Telephone Encounter (Signed)
Whoever refilled it must have erased the instructions - uncool!  I resent it He does need appt though, last A1C was 05/2019 Please call pt to schedule Lab orders are in - get blood work 1-2 days at least - prior to visit

## 2019-10-29 ENCOUNTER — Other Ambulatory Visit: Payer: Self-pay

## 2019-10-29 DIAGNOSIS — R972 Elevated prostate specific antigen [PSA]: Secondary | ICD-10-CM

## 2019-10-29 DIAGNOSIS — Z794 Long term (current) use of insulin: Secondary | ICD-10-CM

## 2019-10-29 DIAGNOSIS — E119 Type 2 diabetes mellitus without complications: Secondary | ICD-10-CM

## 2019-10-29 DIAGNOSIS — E782 Mixed hyperlipidemia: Secondary | ICD-10-CM

## 2019-10-29 NOTE — Telephone Encounter (Signed)
Patient advised. He will call back to schedule.  ?

## 2019-11-20 ENCOUNTER — Ambulatory Visit (INDEPENDENT_AMBULATORY_CARE_PROVIDER_SITE_OTHER): Payer: 59 | Admitting: Osteopathic Medicine

## 2019-11-20 ENCOUNTER — Encounter: Payer: Self-pay | Admitting: Osteopathic Medicine

## 2019-11-20 ENCOUNTER — Other Ambulatory Visit: Payer: Self-pay

## 2019-11-20 DIAGNOSIS — E782 Mixed hyperlipidemia: Secondary | ICD-10-CM | POA: Diagnosis not present

## 2019-11-20 DIAGNOSIS — I10 Essential (primary) hypertension: Secondary | ICD-10-CM

## 2019-11-20 MED ORDER — ATORVASTATIN CALCIUM 80 MG PO TABS: 80.0000 mg | ORAL_TABLET | Freq: Every day | ORAL | 3 refills | Status: DC

## 2019-11-20 MED ORDER — ATORVASTATIN CALCIUM 40 MG PO TABS: 40.0000 mg | ORAL_TABLET | Freq: Every day | ORAL | 3 refills | Status: DC

## 2019-11-20 MED ORDER — LISINOPRIL 40 MG PO TABS: 40.0000 mg | ORAL_TABLET | Freq: Every day | ORAL | 3 refills | Status: DC

## 2019-11-20 NOTE — Progress Notes (Signed)
Jesse Calhoun is a 64 y.o. male who presents to  [place] at [facility]  today, 11/20/19, seeking care for the following: . Diabetes . Labs follow-up     ASSESSMENT & PLAN with other pertinent history/findings:  Diagnoses of Essential hypertension and Mixed hyperlipidemia were pertinent to this visit.  Insulin increased at last visit from 42 to 45 units daily A1C still creeping up   PSA pending  Advised we can probably increase insulin to 50 units He really needs to work on diet/exercise   We increased atorvastatin today from 40 to 80  Meds ordered this encounter  Medications  . lisinopril (ZESTRIL) 40 MG tablet    Sig: Take 1 tablet (40 mg total) by mouth daily.    Dispense:  90 tablet    Refill:  3  . DISCONTD: atorvastatin (LIPITOR) 40 MG tablet    Sig: Take 1 tablet (40 mg total) by mouth daily.    Dispense:  90 tablet    Refill:  3  . atorvastatin (LIPITOR) 80 MG tablet    Sig: Take 1 tablet (80 mg total) by mouth daily.    Dispense:  90 tablet    Refill:  3    CANCEL 40 MG DOSE, THANKS       Follow-up instructions: Return in about 3 months (around 02/20/2020) for recheck A1C and cholesterol - if numbers no better will need to adjust medications .                                         BP 133/81 (BP Location: Left Arm, Patient Position: Sitting, Cuff Size: Large)   Pulse 75   Temp 97.6 F (36.4 C) (Oral)   Wt 228 lb 0.6 oz (103.4 kg)   BMI 34.67 kg/m   Current Meds  Medication Sig  . ACCU-CHEK AVIVA PLUS test strip USE TO CHECK BLOOD SUGAR TWICE DAILY AS DIRECTED  . Accu-Chek Softclix Lancets lancets USE TO CHECK BLOOD SUGAR TWICE DAILY AS DIRECTED  . atorvastatin (LIPITOR) 80 MG tablet Take 1 tablet (80 mg total) by mouth daily.  . B-D ULTRAFINE III SHORT PEN 31G X 8 MM MISC USE DAILY AS DIRECTED  . Insulin Glargine (BASAGLAR KWIKPEN) 100 UNIT/ML Inject 0.42 mLs (42  Units total) into the skin at bedtime.  Marland Kitchen lisinopril (ZESTRIL) 40 MG tablet Take 1 tablet (40 mg total) by mouth daily.  . naproxen (NAPROSYN) 500 MG tablet TAKE 1 TABLET(500 MG) BY MOUTH TWICE DAILY WITH A MEAL AS NEEDED FOR SHOULDER OR PAIN  . sildenafil (REVATIO) 20 MG tablet Take 1-5 tablets (20-100 mg total) by mouth as needed (prior to sex).  . [DISCONTINUED] atorvastatin (LIPITOR) 40 MG tablet TAKE ONE TABLET BY MOUTH DAILY AT 6P.M.  . [DISCONTINUED] atorvastatin (LIPITOR) 40 MG tablet Take 1 tablet (40 mg total) by mouth daily.  . [DISCONTINUED] lisinopril (ZESTRIL) 40 MG tablet Take 1 tablet (40 mg total) by mouth daily.  . [DISCONTINUED] metFORMIN (GLUCOPHAGE-XR) 500 MG 24 hr tablet Take 2 tablets (1,000 mg total) by mouth daily with breakfast. (Start with 1 tablet [500 mg] daily for 1-2 weeks)    Results for orders placed or performed in visit on 10/29/19 (from the past 72 hour(s))  Testosterone     Status: None   Collection Time: 11/19/19  8:08 AM  Result Value Ref Range  Testosterone 301 250 - 827 ng/dL  Lipid Profile     Status: Abnormal   Collection Time: 11/19/19  8:08 AM  Result Value Ref Range   Cholesterol 176 <200 mg/dL   HDL 34 (L) > OR = 40 mg/dL   Triglycerides 197 (H) <150 mg/dL   LDL Cholesterol (Calc) 110 (H) mg/dL (calc)    Comment: Reference range: <100 . Desirable range <100 mg/dL for primary prevention;   <70 mg/dL for patients with CHD or diabetic patients  with > or = 2 CHD risk factors. Marland Kitchen LDL-C is now calculated using the Martin-Hopkins  calculation, which is a validated novel method providing  better accuracy than the Friedewald equation in the  estimation of LDL-C.  Cresenciano Genre et al. Annamaria Helling. [id number]): 2061-2068  (http://education.[facility].com/faq/FAQ164)    Total CHOL/HDL Ratio 5.2 (H) <5.0 (calc)   Non-HDL Cholesterol (Calc) 142 (H) <130 mg/dL (calc)    Comment: For patients with diabetes plus 1 major ASCVD risk  factor, treating to  a non-HDL-C goal of <100 mg/dL  (LDL-C of <70 mg/dL) is considered a therapeutic  option.   HgB A1c     Status: Abnormal   Collection Time: 11/19/19  8:08 AM  Result Value Ref Range   Hgb A1c MFr Bld 7.5 (H) <5.7 % of total Hgb    Comment: For someone without known diabetes, a hemoglobin A1c value of 6.5% or greater indicates that they may have  diabetes and this should be confirmed with a follow-up  test. . For someone with known diabetes, a value <7% indicates  that their diabetes is well controlled and a value  greater than or equal to 7% indicates suboptimal  control. A1c targets should be individualized based on  duration of diabetes, age, comorbid conditions, and  other considerations. . Currently, no consensus exists regarding use of hemoglobin A1c for diagnosis of diabetes for children. .    Mean Plasma Glucose 169 (calc)   eAG (mmol/L) 9.3 (calc)    No results found.  Depression screen [facility] 2/9 06/06/2019 03/07/2019 08/24/2018  Decreased Interest 0 1 0  Down, Depressed, Hopeless 0 0 0  PHQ - 2 Score 0 1 0  Altered sleeping - 2 1  Tired, decreased energy - 2 1  Change in appetite - 2 0  Feeling bad or failure about yourself  - 0 0  Trouble concentrating - 0 0  Moving slowly or fidgety/restless - 0 0  Suicidal thoughts - 0 0  PHQ-9 Score - 7 2  Difficult doing work/chores - Not difficult at all Not difficult at all    GAD 7 : Generalized Anxiety Score 06/06/2019 03/07/2019 08/24/2018 05/25/2018  Nervous, Anxious, on Edge 0 1 1 1   Control/stop worrying 0 0 0 0  Worry too much - different things 0 0 0 1  Trouble relaxing 0 0 0 0  Restless 0 0 0 0  Easily annoyed or irritable 1 0 0 0  Afraid - awful might happen 0 0 0 0  Total GAD 7 Score 1 1 1 2   Anxiety Difficulty - Not difficult at all Not difficult at all Not difficult at all      All questions at time of visit were answered - patient instructed to contact office with any additional concerns or updates.   ER/RTC precautions were reviewed with the patient.  Please note: voice recognition software was used to produce this document, and typos may escape review. Please contact Dr. Sheppard Coil for any needed  clarifications.

## 2019-11-22 ENCOUNTER — Other Ambulatory Visit: Payer: Self-pay | Admitting: Osteopathic Medicine

## 2019-11-22 DIAGNOSIS — R972 Elevated prostate specific antigen [PSA]: Secondary | ICD-10-CM

## 2019-11-22 LAB — LIPID PANEL
Cholesterol: 176 mg/dL (ref <200)
HDL: 34 mg/dL — ABNORMAL LOW (ref >=40)
LDL Cholesterol (Calc): 110 mg/dL (calc) — ABNORMAL HIGH
Non-HDL Cholesterol (Calc): 142 mg/dL (calc) — ABNORMAL HIGH (ref <130)
Total CHOL/HDL Ratio: 5.2 (calc) — ABNORMAL HIGH (ref <5.0)
Triglycerides: 197 mg/dL — ABNORMAL HIGH (ref <150)

## 2019-11-22 LAB — HEMOGLOBIN A1C
Hgb A1c MFr Bld: 7.5 % of total Hgb — ABNORMAL HIGH (ref <5.7)
Mean Plasma Glucose: 169 (calc)
eAG (mmol/L): 9.3 (calc)

## 2019-11-22 LAB — TESTOSTERONE: Testosterone: 301 ng/dL (ref 250–827)

## 2019-11-22 LAB — PSA, TOTAL WITH REFLEX TO PSA, FREE: PSA, Total: 5.5 ng/mL — ABNORMAL HIGH (ref <=4.0)

## 2019-11-22 LAB — REFLEX PSA, FREE
PSA, % Free: 24 % (calc) — ABNORMAL LOW (ref >25)
PSA, Free: 1.3 ng/mL

## 2020-01-29 ENCOUNTER — Other Ambulatory Visit: Payer: Self-pay | Admitting: Osteopathic Medicine

## 2020-02-03 ENCOUNTER — Other Ambulatory Visit: Payer: Self-pay | Admitting: Osteopathic Medicine

## 2020-02-05 ENCOUNTER — Other Ambulatory Visit: Payer: Self-pay

## 2020-02-05 DIAGNOSIS — Z794 Long term (current) use of insulin: Secondary | ICD-10-CM

## 2020-02-05 DIAGNOSIS — E119 Type 2 diabetes mellitus without complications: Secondary | ICD-10-CM

## 2020-02-05 MED ORDER — BD PEN NEEDLE SHORT U/F 31G X 8 MM MISC: 97 refills | Status: DC

## 2020-02-08 ENCOUNTER — Encounter: Payer: Self-pay | Admitting: Osteopathic Medicine

## 2020-02-08 DIAGNOSIS — R972 Elevated prostate specific antigen [PSA]: Secondary | ICD-10-CM | POA: Insufficient documentation

## 2020-02-22 ENCOUNTER — Encounter: Payer: Self-pay | Admitting: Osteopathic Medicine

## 2020-02-22 ENCOUNTER — Other Ambulatory Visit: Payer: Self-pay

## 2020-02-22 ENCOUNTER — Ambulatory Visit (INDEPENDENT_AMBULATORY_CARE_PROVIDER_SITE_OTHER): Payer: 59 | Admitting: Osteopathic Medicine

## 2020-02-22 VITALS — BP 131/77 | HR 67 | Ht 68.0 in | Wt 227.0 lb

## 2020-02-22 DIAGNOSIS — Z794 Long term (current) use of insulin: Secondary | ICD-10-CM | POA: Diagnosis not present

## 2020-02-22 DIAGNOSIS — E119 Type 2 diabetes mellitus without complications: Secondary | ICD-10-CM | POA: Diagnosis not present

## 2020-02-22 DIAGNOSIS — Z23 Encounter for immunization: Secondary | ICD-10-CM

## 2020-02-22 LAB — POCT GLYCOSYLATED HEMOGLOBIN (HGB A1C): Hemoglobin A1C: 7.7 % — AB (ref 4.0–5.6)

## 2020-02-22 MED ORDER — BASAGLAR KWIKPEN 100 UNIT/ML ~~LOC~~ SOPN: 55.0000 [IU] | PEN_INJECTOR | Freq: Every day | SUBCUTANEOUS | 11 refills | Status: DC

## 2020-02-22 NOTE — Progress Notes (Signed)
Jesse Calhoun is a 64 y.o. male who presents to  [place] at [facility]  today, 02/22/20, seeking care for the following:  Diabetes check      ASSESSMENT & PLAN with other pertinent history/findings:  The primary encounter diagnosis was Need for immunization against influenza. A diagnosis of Type 2 diabetes mellitus without complication, with long-term current use of insulin (HCC) was also pertinent to this visit.  A1C was creeping up to 7.5 last visit 11/19/19 We decided to increase insulin from 43-45 units to 50 units  A1C not really better today 02/22/20, up to 7.7  He really needs to work on diet/exercise! --> will increase insulin to max 55 units daily   PSA - following w/ urology   We increased atorvastatin from 40 to 80 on 11/19/19 Plan recheck lipids/CMP next visit  We spent some time discussing COVID questions   Meds ordered this encounter  Medications   Insulin Glargine (BASAGLAR KWIKPEN) 100 UNIT/ML    Sig: Inject 0.55 mLs (55 Units total) into the skin daily.    Dispense:  30 mL    Refill:  11       Follow-up instructions: Return in about 3 months (around 05/24/2020) for monitor A1C .                                         BP 131/77    Pulse 67    Ht 5\' 8"  (1.727 m)    Wt 227 lb (103 kg)    SpO2 98%    BMI 34.52 kg/m   Current Meds  Medication Sig   ACCU-CHEK AVIVA PLUS test strip USE TO CHECK BLOOD SUGAR TWICE DAILY AS DIRECTED   Accu-Chek Softclix Lancets lancets USE TO CHECK BLOOD SUGAR TWICE DAILY AS DIRECTED   atorvastatin (LIPITOR) 80 MG tablet Take 1 tablet (80 mg total) by mouth daily.   Insulin Glargine (BASAGLAR KWIKPEN) 100 UNIT/ML Inject 0.55 mLs (55 Units total) into the skin daily.   Insulin Pen Needle (B-D ULTRAFINE III SHORT PEN) 31G X 8 MM MISC USE DAILY AS DIRECTED   lisinopril (ZESTRIL) 40 MG tablet Take 1 tablet (40 mg total) by mouth daily.    naproxen (NAPROSYN) 500 MG tablet TAKE 1 TABLET(500 MG) BY MOUTH TWICE DAILY WITH A MEAL AS NEEDED FOR SHOULDER OR PAIN   sildenafil (REVATIO) 20 MG tablet Take 1-5 tablets (20-100 mg total) by mouth as needed (prior to sex).    Results for orders placed or performed in visit on 02/22/20 (from the past 72 hour(s))  POCT glycosylated hemoglobin (Hb A1C)     Status: Abnormal   Collection Time: 02/22/20  8:35 AM  Result Value Ref Range   Hemoglobin A1C 7.7 (A) 4.0 - 5.6 %   HbA1c POC (<> result, manual entry)     HbA1c, POC (prediabetic range)     HbA1c, POC (controlled diabetic range)      No results found.  Depression screen [facility] 2/9 06/06/2019 03/07/2019 08/24/2018  Decreased Interest 0 1 0  Down, Depressed, Hopeless 0 0 0  PHQ - 2 Score 0 1 0  Altered sleeping - 2 1  Tired, decreased energy - 2 1  Change in appetite - 2 0  Feeling bad or failure about yourself  - 0 0  Trouble concentrating - 0 0  Moving slowly or  fidgety/restless - 0 0  Suicidal thoughts - 0 0  PHQ-9 Score - 7 2  Difficult doing work/chores - Not difficult at all Not difficult at all    GAD 7 : Generalized Anxiety Score 06/06/2019 03/07/2019 08/24/2018 05/25/2018  Nervous, Anxious, on Edge 0 1 1 1   Control/stop worrying 0 0 0 0  Worry too much - different things 0 0 0 1  Trouble relaxing 0 0 0 0  Restless 0 0 0 0  Easily annoyed or irritable 1 0 0 0  Afraid - awful might happen 0 0 0 0  Total GAD 7 Score 1 1 1 2   Anxiety Difficulty - Not difficult at all Not difficult at all Not difficult at all      All questions at time of visit were answered - patient instructed to contact office with any additional concerns or updates.  ER/RTC precautions were reviewed with the patient.  Please note: voice recognition software was used to produce this document, and typos may escape review. Please contact Dr. Sheppard Coil for any needed clarifications.

## 2020-05-23 ENCOUNTER — Ambulatory Visit: Payer: 59 | Admitting: Osteopathic Medicine

## 2020-05-23 ENCOUNTER — Encounter: Payer: Self-pay | Admitting: Osteopathic Medicine

## 2020-05-23 ENCOUNTER — Other Ambulatory Visit: Payer: Self-pay

## 2020-05-23 VITALS — BP 141/82 | HR 75 | Temp 97.8°F | Wt 228.1 lb

## 2020-05-23 DIAGNOSIS — Z794 Long term (current) use of insulin: Secondary | ICD-10-CM | POA: Diagnosis not present

## 2020-05-23 DIAGNOSIS — E119 Type 2 diabetes mellitus without complications: Secondary | ICD-10-CM

## 2020-05-23 DIAGNOSIS — C61 Malignant neoplasm of prostate: Secondary | ICD-10-CM | POA: Diagnosis not present

## 2020-05-23 HISTORY — DX: Malignant neoplasm of prostate: C61

## 2020-05-23 LAB — POCT GLYCOSYLATED HEMOGLOBIN (HGB A1C): Hemoglobin A1C: 8.2 % — AB (ref 4.0–5.6)

## 2020-05-23 NOTE — Progress Notes (Signed)
Jesse Calhoun is a 64 y.o. male who presents to  [place] at [facility]  today, 05/23/20, seeking care for the following:  . DM2 follow-up  . Discuss recent dx prostate CA - following w/ urology      ASSESSMENT & PLAN with other pertinent findings:  The primary encounter diagnosis was Type 2 diabetes mellitus without complication, with long-term current use of insulin ([place]). A diagnosis of Prostate cancer [facility]) was also pertinent to this visit.    1. Type 2 diabetes mellitus without complication, with long-term current use of insulin (HCC)   A1C was creeping up to 7.5 on 11/19/19. We decided to increase insulin from 43-45 units to 50 units   A1C not really better  02/22/20, up to 7.7. He really needs to work on diet/exercise! We increased insulin to max 55 units daily   Today 05/23/20 A1C at 8.2 understandably more stressed w/ recent CA dx. Taking 53 units insulin. We discussed addition of GLP1 but pt would like to work seriously on diet before starting new Rx. Advised can at least increase insulin to target fasting Glc 80-130. He is getting mostly 140, occasionly up to 180 FBG  2. Prostate cancer ([place])  Answered a few questions mostly regarding decision-making w/ his specialists and how to generate good questions for them. He is nervous to undergo surgery and radiation and other scans. We chatted about this a bit and I think he is prepared to ask urology specific questions and participate in shared decision-making      Results for orders placed or performed in visit on 05/23/20 (from the past 24 hour(s))  POCT HgB A1C     Status: Abnormal   Collection Time: 05/23/20  8:29 AM  Result Value Ref Range   Hemoglobin A1C 8.2 (A) 4.0 - 5.6 %   HbA1c POC (<> result, manual entry)     HbA1c, POC (prediabetic range)     HbA1c, POC (controlled diabetic range)         There are no Patient Instructions on file for this visit.  Orders  Placed This Encounter  Procedures  . POCT HgB A1C    No orders of the defined types were placed in this encounter.      Follow-up instructions: Return in about 3 months (around 08/23/2020) for Monitor A1C .                                         BP (!) 141/82 (BP Location: Left Arm, Patient Position: Sitting, Cuff Size: Large)   Pulse 75   Temp 97.8 F (36.6 C) (Oral)   Wt 228 lb 1.9 oz (103.5 kg)   BMI 34.69 kg/m   No outpatient medications have been marked as taking for the 05/23/20 encounter (Office Visit) with Emeterio Reeve, DO.    Results for orders placed or performed in visit on 05/23/20 (from the past 72 hour(s))  POCT HgB A1C     Status: Abnormal   Collection Time: 05/23/20  8:29 AM  Result Value Ref Range   Hemoglobin A1C 8.2 (A) 4.0 - 5.6 %   HbA1c POC (<> result, manual entry)     HbA1c, POC (prediabetic range)     HbA1c, POC (controlled diabetic range)      No results found.     All questions at time of visit were  answered - patient instructed to contact office with any additional concerns or updates.  ER/RTC precautions were reviewed with the patient as applicable.   Please note: voice recognition software was used to produce this document, and typos may escape review. Please contact Dr. Sheppard Coil for any needed clarifications.   Total encounter time: 30 minutes counseling and coordinating care, reviewing records and labs

## 2020-06-02 ENCOUNTER — Other Ambulatory Visit: Payer: Self-pay | Admitting: Urology

## 2020-06-02 DIAGNOSIS — C61 Malignant neoplasm of prostate: Secondary | ICD-10-CM

## 2020-06-11 ENCOUNTER — Encounter (HOSPITAL_COMMUNITY)
Admission: RE | Admit: 2020-06-11 | Discharge: 2020-06-11 | Disposition: A | Discharge status: Home / Self Care | Payer: 59 | Source: Ambulatory Visit | Attending: Urology | Admitting: Urology

## 2020-06-11 ENCOUNTER — Other Ambulatory Visit: Payer: Self-pay

## 2020-06-11 DIAGNOSIS — C61 Malignant neoplasm of prostate: Secondary | ICD-10-CM | POA: Diagnosis not present

## 2020-06-11 MED ORDER — TECHNETIUM TC 99M MEDRONATE IV KIT
20.9000 | PACK | Freq: Once | INTRAVENOUS | Status: AC
  Administered 2020-06-11: 20.9 via INTRAVENOUS

## 2020-08-22 ENCOUNTER — Encounter: Payer: Self-pay | Admitting: Osteopathic Medicine

## 2020-08-22 ENCOUNTER — Ambulatory Visit (INDEPENDENT_AMBULATORY_CARE_PROVIDER_SITE_OTHER): Payer: 59 | Admitting: Osteopathic Medicine

## 2020-08-22 ENCOUNTER — Other Ambulatory Visit: Payer: Self-pay

## 2020-08-22 VITALS — BP 154/80 | HR 80 | Temp 97.8°F | Resp 20 | Wt 229.0 lb

## 2020-08-22 DIAGNOSIS — Z794 Long term (current) use of insulin: Secondary | ICD-10-CM | POA: Diagnosis not present

## 2020-08-22 DIAGNOSIS — C61 Malignant neoplasm of prostate: Secondary | ICD-10-CM

## 2020-08-22 DIAGNOSIS — E119 Type 2 diabetes mellitus without complications: Secondary | ICD-10-CM

## 2020-08-22 LAB — POCT GLYCOSYLATED HEMOGLOBIN (HGB A1C): Hemoglobin A1C: 8.3 % — AB (ref 4.0–5.6)

## 2020-08-22 NOTE — Progress Notes (Signed)
Jesse Calhoun is a 65 y.o. male who presents to  [place] at [facility]  today, 08/22/20, seeking care for the following:  . Follow-up diabetes: A1C up a bit, work is going well but also increased stress, he is relatively diligent about low-carb diet, he is unable to get much activity/exercise.  . Prostate CA: he is nervous to undergo surgery, would like to speak to someone about option to just do radiation.      ASSESSMENT & PLAN with other pertinent findings:  The primary encounter diagnosis was Type 2 diabetes mellitus without complication, with long-term current use of insulin ([place]). A diagnosis of Prostate cancer [facility]) was also pertinent to this visit.   DM2 worse - work on diet/exercise, referred to MNT  Referral to RadOnc to discuss risk/benefit of radiation vs surgery vs both Pt to monitor BP at home, above goal in office today, would like to work on weight loss, defer Rx adjustment for now   There are no Patient Instructions on file for this visit.  Orders Placed This Encounter  Procedures  . Amb ref to Medical Nutrition Therapy-MNT  . Ambulatory referral to Radiation Oncology: fax [phone]    . POCT glycosylated hemoglobin (Hb A1C)    No orders of the defined types were placed in this encounter.    See below for relevant physical exam findings  See below for recent lab and imaging results reviewed  Medications, allergies, PMH, PSH, SocH, FamH reviewed below    Follow-up instructions: Return in about 3 months (around 11/19/2020) for A1C RECHECK, SEE ME SOONER IF NEEDED.                                        Exam:  BP (!) 154/80 (BP Location: Left Arm, Cuff Size: Normal)   Pulse 80   Temp 97.8 F (36.6 C) (Oral)   Resp 20   Wt 229 lb (103.9 kg)   SpO2 97%   BMI 34.82 kg/m   Constitutional: VS see above. General Appearance: alert, well-developed, well-nourished,  NAD  Neck: No masses, trachea midline.   Respiratory: Normal respiratory effort. no wheeze, no rhonchi, no rales  Cardiovascular: S1/S2 normal, no murmur, no rub/gallop auscultated. RRR.   Musculoskeletal: Gait normal. Symmetric and independent movement of all extremities  Abdominal: non-tender, non-distended, no appreciable organomegaly, neg Murphy's, BS WNLx4  Neurological: Normal balance/coordination. No tremor.  Skin: warm, dry, intact.   Psychiatric: Normal judgment/insight. Normal mood and affect. Oriented x3.   Current Meds  Medication Sig  . ACCU-CHEK AVIVA PLUS test strip USE TO CHECK BLOOD SUGAR TWICE DAILY AS DIRECTED  . Accu-Chek Softclix Lancets lancets USE TO CHECK BLOOD SUGAR TWICE DAILY AS DIRECTED  . atorvastatin (LIPITOR) 80 MG tablet Take 1 tablet (80 mg total) by mouth daily.  . Insulin Glargine (BASAGLAR KWIKPEN) 100 UNIT/ML Inject 0.55 mLs (55 Units total) into the skin daily.  . Insulin Pen Needle (B-D ULTRAFINE III SHORT PEN) 31G X 8 MM MISC USE DAILY AS DIRECTED  . lisinopril (ZESTRIL) 40 MG tablet Take 1 tablet (40 mg total) by mouth daily.  . naproxen (NAPROSYN) 500 MG tablet TAKE 1 TABLET(500 MG) BY MOUTH TWICE DAILY WITH A MEAL AS NEEDED FOR SHOULDER OR PAIN  . sildenafil (REVATIO) 20 MG tablet Take 1-5 tablets (20-100 mg total) by mouth as needed (prior to sex).  No Known Allergies  Patient Active Problem List   Diagnosis Date Noted  . Prostate cancer ([place]) 05/23/2020  . Elevated PSA 02/08/2020  . 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 Type 2 diabetes mellitus without complication, with long-term current use of insulin ([place]) 07/21/2015  . Hyperlipidemia 03/26/2015  . Hoarseness of voice 03/19/2015  . Essential hypertension 03/19/2015    Family History  Problem Relation Age of Onset  . Diabetes Mother     Social History   Tobacco Use  Smoking Status Heavy Tobacco Smoker  . Packs/day: 5.00  . Years: 0.00  . Pack years: 0.00  .  Types: Cigars  Smokeless Tobacco Never Used    Past Surgical History:  Procedure Laterality Date  . KNEE SURGERY     age 73    Immunization History  Administered Date(s) Administered  . Influenza,inj,Quad PF,6+ Mos 06/03/2015, 02/22/2020  . Influenza-Unspecified 04/15/2016, 04/15/2017, 04/15/2018  . PFIZER(Purple Top)SARS-COV-2 Vaccination 09/05/2019, 09/26/2019, 04/04/2020  . Pneumococcal Polysaccharide-23 06/03/2015  . Tdap 06/03/2015    Recent Results (from the past 2160 hour(s))  POCT glycosylated hemoglobin (Hb A1C)     Status: Abnormal   Collection Time: 08/22/20  9:06 AM  Result Value Ref Range   Hemoglobin A1C 8.3 (A) 4.0 - 5.6 %   HbA1c POC (<> result, manual entry)     HbA1c, POC (prediabetic range)     HbA1c, POC (controlled diabetic range)      No results found.     All questions at time of visit were answered - patient instructed to contact office with any additional concerns or updates. ER/RTC precautions were reviewed with the patient as applicable.   Please note: manual typing as well as voice recognition software may have been used to produce this document - typos may escape review. Please contact Dr. Sheppard Coil for any needed clarifications.

## 2020-08-28 ENCOUNTER — Encounter: Payer: Self-pay | Admitting: Radiation Oncology

## 2020-08-28 NOTE — Progress Notes (Signed)
GU Location of Tumor / Histology: prostatic adenocarcinoma  If Prostate Cancer, Gleason Score is (4 + 4) and PSA is (3.59). Prostate volume: 107 g.   Jesse Calhoun presented with an elevated PSA and prostate nodule. Patient explains he was seen by his PCP in May 2021 for a routine physical and due to his age a PSA was collected which proved to be elevated. Patient confirms this is the first time he was ever told his PSA was elevated. Patient denies a family hx of prostate ca.  Biopsies of prostate (if applicable) revealed:    Past/Anticipated interventions by urology, if any: prostate biopsy, CT abd/pelvis (negative), bone scan (left sixth rib), referral to Dr. Tammi Klippel  Past/Anticipated interventions by medical oncology, if any: no  Weight changes, if any: no  Bowel/Bladder complaints, if any: IPSS 10. SHIM 8. Denies dysuria, hematuria, urinary leakage or incontinence.Reports nocturia x 1. Reports an occasional weak urine stream. Reports urinary hesitancy and urgency.    Nausea/Vomiting, if any: no  Pain issues, if any:  denies  SAFETY ISSUES:  Prior radiation? denies  Pacemaker/ICD? denies  Possible current pregnancy? no, male patient  Is the patient on methotrexate? no  Current Complaints / other details:  65 year old male. 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 Denies a family hx of prostate cancer. Smoker since 01/2000 approximately 5 cigars per day.

## 2020-08-31 NOTE — Progress Notes (Signed)
Radiation Oncology         [phone] ________________________________  Initial outpatient Consultation  Name: Jesse Calhoun MRN: [id number]  Date: 09/01/2020  DOB: 05-08-1956  CH:YIFOYDXAJ, Lanelle Bal, DO  Emeterio Reeve, DO   REFERRING PHYSICIAN: Emeterio Reeve, DO  DIAGNOSIS: 65 y.o. 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 Prostate cancer ([place])  C61     HISTORY OF PRESENT ILLNESS::Jesse Calhoun is a 65 y.o. gentleman.  He was noted to have an elevated PSA of 5.5 by his primary care physician, Dr. Sheppard Coil.  Accordingly, he was referred for evaluation in urology by Dr. Junious Silk in July 2021,  digital rectal examination was performed at that time revealing a 10 mm nodule in the right base, and repeat PSA was 3.59.  The patient proceeded to transrectal ultrasound with 12 biopsies of the prostate on 05/13/20.  The prostate volume measured 107 cc.  Out of 12 core biopsies, 3 were positive.  The maximum Gleason score was 4+4, and this was seen in right mid, with 3+4 in the right lateral base and right mid lateral.  Staging bone scan and CT showed no metastatic disease.  The patient reviewed the biopsy results with his urologist and he has kindly been referred today for discussion of potential radiation treatment options.     PREVIOUS RADIATION THERAPY: No  PAST MEDICAL HISTORY:  has a past medical history of Cataract, Diabetes mellitus without complication ([place]), Hypertension, and Prostate cancer ([place]) (05/23/2020).    PAST SURGICAL HISTORY: Past Surgical History:  Procedure Laterality Date  . KNEE SURGERY     age 60  . PROSTATE BIOPSY      FAMILY HISTORY: family history includes Cancer in his father; Diabetes in his mother.  SOCIAL HISTORY:  reports that he has been smoking cigars. He has been smoking about 5.00 packs per day for the past 0.00 years. He has never used smokeless tobacco. He  reports that he does not drink alcohol and does not use drugs.  ALLERGIES: Patient has no known allergies.  MEDICATIONS:  Current Outpatient Medications  Medication Sig Dispense Refill  . ACCU-CHEK AVIVA PLUS test strip USE TO CHECK BLOOD SUGAR TWICE DAILY AS DIRECTED 200 strip 97  . Accu-Chek Softclix Lancets lancets USE TO CHECK BLOOD SUGAR TWICE DAILY AS DIRECTED 100 each 97  . atorvastatin (LIPITOR) 80 MG tablet Take 1 tablet (80 mg total) by mouth daily. 90 tablet 3  . Insulin Glargine (BASAGLAR KWIKPEN) 100 UNIT/ML Inject 0.55 mLs (55 Units total) into the skin daily. 30 mL 11  . Insulin Pen Needle (B-D ULTRAFINE III SHORT PEN) 31G X 8 MM MISC USE DAILY AS DIRECTED 100 each 97  . lisinopril (ZESTRIL) 40 MG tablet Take 1 tablet (40 mg total) by mouth daily. 90 tablet 3  . ofloxacin (OCUFLOX) 0.3 % ophthalmic solution     . sildenafil (REVATIO) 20 MG tablet Take 1-5 tablets (20-100 mg total) by mouth as needed (prior to sex). 50 tablet 11   No current facility-administered medications for this encounter.    REVIEW OF SYSTEMS:  A 15 point review of systems is documented in the electronic medical record. This was obtained by the nursing staff. However, I reviewed this with the patient to discuss relevant findings and make appropriate changes.  Pertinent items are noted in HPI..  The patient completed an IPSS and IIEF questionnaire.  His IPSS  score was 10 indicating mild to moderate urinary outflow obstructive symptoms.   PHYSICAL EXAM: This patient is in no acute distress.  He is alert and oriented.   height is 5\' 8"  (1.727 m) and weight is 231 lb 3.2 oz (104.9 kg). His temperature is 98 F (36.7 C). His blood pressure is 105/59 (abnormal) and his pulse is 80. His respiration is 20 and oxygen saturation is 99%.  He exhibits no respiratory distress or labored breathing.  He appears neurologically intact.  His mood is pleasant.  His affect is appropriate.  Please note the digital rectal exam  findings described above.  KPS = 100  100 - Normal; no complaints; no evidence of disease. 90   - Able to carry on normal activity; minor signs or symptoms of disease. 80   - Normal activity with effort; some signs or symptoms of disease. 66   - Cares for self; unable to carry on normal activity or to do active work. 60   - Requires occasional assistance, but is able to care for most of his personal needs. 50   - Requires considerable assistance and frequent medical care. 40   - Disabled; requires special care and assistance. 10   - Severely disabled; hospital admission is indicated although death not imminent. 72   - Very sick; hospital admission necessary; active supportive treatment necessary. 10   - Moribund; fatal processes progressing rapidly. 0     - Dead  Karnofsky DA, Abelmann Esto, Craver LS and Burchenal [facility] [phone]) The use of the nitrogen mustards in the palliative treatment of carcinoma: with particular reference to bronchogenic carcinoma Cancer 1 634-56   LABORATORY DATA:  Lab Results  Component Value Date   WBC 8.0 04/19/2017   HGB 15.2 04/19/2017   HCT 44.6 04/19/2017   MCV 89.2 04/19/2017   PLT 230 04/19/2017   Lab Results  Component Value Date   NA 137 03/05/2019   K 5.2 03/05/2019   CL 106 03/05/2019   CO2 23 03/05/2019   Lab Results  Component Value Date   ALT 17 03/05/2019   AST 15 03/05/2019   ALKPHOS 61 04/16/2016   BILITOT 0.4 03/05/2019     RADIOGRAPHY: No results found.    IMPRESSION: This gentleman is a 65 y.o. 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  His Gleason's Score puts him into the high risk group.  Accordingly he is eligible for a variety of potential treatment options including LA-RALProstatectomy versus Long Term Androgen Deprivation Therapy (LT-ADT) with radiation therapy.Marland Kitchen  PLAN:Today I reviewed the findings and workup thus far.  We discussed the natural history of  prostate cancer.  We reviewed the the implications of T-stage, Gleason's Score, and PSA on decision-making and outcomes in prostate cancer.  We discussed radiation treatment in the management of prostate cancer with regard to the logistics and delivery of external beam radiation treatment as well as the logistics and delivery of prostate brachytherapy.  We compared and contrasted each of these approaches and also compared these against prostatectomy.  The patient expressed interest in external beam radiotherapy.  I filled out a patient counseling form for him with relevant treatment diagrams and we retained a copy for our records.   We discussed the interaction of ADT and also the usage of SpaceOAR to spare rectum from radiaiton.  The patient would like to avoid any downtime associated with surgery and is leaning toward  prostate IMRT.  He plans to make a decision soon.  If he decides IMRT, we'll set up starting hormone therapy now.  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  It is a multi-point lock that is inexpensive, and he anticipates a surge in work in July as a result.  As such, he may want to start ADT now and wait to get IMRT in August or September.  Once he establishes his decision, we can begin to coordinate placement of three gold fiducial markers into the prostate and SpaceOAR to proceed with IMRT in the near future.     I enjoyed meeting with him today, and will look forward to participating in the care of this very nice gentleman.   I spent 60 minutes in this encounter total.   ------------------------------------------------  Sheral Apley. Tammi Klippel, M.D.

## 2020-09-01 ENCOUNTER — Encounter: Payer: Self-pay | Admitting: Medical Oncology

## 2020-09-01 ENCOUNTER — Encounter: Payer: Self-pay | Admitting: Radiation Oncology

## 2020-09-01 ENCOUNTER — Other Ambulatory Visit: Payer: Self-pay

## 2020-09-01 ENCOUNTER — Ambulatory Visit
Admission: RE | Admit: 2020-09-01 | Discharge: 2020-09-01 | Disposition: A | Discharge status: Home / Self Care | Payer: 59 | Source: Ambulatory Visit | Attending: Radiation Oncology | Admitting: Radiation Oncology

## 2020-09-01 VITALS — BP 105/59 | HR 80 | Temp 98.0°F | Resp 20 | Ht 68.0 in | Wt 231.2 lb

## 2020-09-01 DIAGNOSIS — F17210 Nicotine dependence, cigarettes, uncomplicated: Secondary | ICD-10-CM | POA: Diagnosis not present

## 2020-09-01 DIAGNOSIS — C61 Malignant neoplasm of prostate: Secondary | ICD-10-CM

## 2020-09-01 DIAGNOSIS — Z79899 Other long term (current) drug therapy: Secondary | ICD-10-CM | POA: Insufficient documentation

## 2020-09-01 DIAGNOSIS — E119 Type 2 diabetes mellitus without complications: Secondary | ICD-10-CM | POA: Insufficient documentation

## 2020-09-01 DIAGNOSIS — I10 Essential (primary) hypertension: Secondary | ICD-10-CM | POA: Diagnosis not present

## 2020-09-01 DIAGNOSIS — Z794 Long term (current) use of insulin: Secondary | ICD-10-CM | POA: Diagnosis not present

## 2020-09-02 ENCOUNTER — Ambulatory Visit: Payer: 59

## 2020-09-02 ENCOUNTER — Ambulatory Visit: Payer: 59 | Admitting: Radiation Oncology

## 2020-10-21 ENCOUNTER — Encounter: Payer: Self-pay | Admitting: Medical Oncology

## 2020-10-22 ENCOUNTER — Telehealth: Payer: Self-pay | Admitting: *Deleted

## 2020-10-22 NOTE — Telephone Encounter (Signed)
CALLED PATIENT TO INFORM THAT DR. Junious Silk AND NURSE ARE ON VACATION THIS WEEK, THEREFORE I WAS UNABLE TO BOOK ADT APPT., I WILL CALL DR. Lyndal Rainbow OFFICE ON 10/27/20 AND ATTEMPT TO SCHEDULE PATIENT, I WILL UPDATE PATIENT REGARDING THIS APPT. ON 10-27-20, PATIENT VERIFIED UNDERSTANDING THIS

## 2020-11-03 ENCOUNTER — Telehealth: Payer: Self-pay | Admitting: *Deleted

## 2020-11-03 NOTE — Telephone Encounter (Signed)
CALLED PATIENT TO INFORM OF ADT APPT. FOR 11-07-20 - ARRIVALTIME-1:15 PM, SPOKE WITH PATIENT AND HE IS AWARE OF THIS APPT. (APPT. TO BE @ DR. Lyndal Rainbow OFFICE)

## 2020-11-13 ENCOUNTER — Encounter: Payer: Self-pay | Admitting: Urology

## 2020-11-13 ENCOUNTER — Encounter: Payer: Self-pay | Admitting: Medical Oncology

## 2020-11-13 NOTE — Progress Notes (Signed)
Patient got a 6 month Eligard injection on 11/07/20 so we will coordinate for fiducial markers and SpaceOAR gel placement in late July, prior to his planned vacation 02/01/21 - 02/08/21, in anticipation of beginning his daily radiation on return. SH notified to proceed with scheduling.  Nicholos Johns, MMS, PA-C [facility] [place]: [phone]  Fax: [phone]

## 2020-11-16 ENCOUNTER — Other Ambulatory Visit: Payer: Self-pay | Admitting: Osteopathic Medicine

## 2020-11-20 ENCOUNTER — Ambulatory Visit (INDEPENDENT_AMBULATORY_CARE_PROVIDER_SITE_OTHER): Payer: 59 | Admitting: Osteopathic Medicine

## 2020-11-20 ENCOUNTER — Encounter: Payer: Self-pay | Admitting: Osteopathic Medicine

## 2020-11-20 ENCOUNTER — Other Ambulatory Visit: Payer: Self-pay

## 2020-11-20 VITALS — BP 146/61 | HR 68 | Temp 98.1°F | Wt 231.0 lb

## 2020-11-20 DIAGNOSIS — E785 Hyperlipidemia, unspecified: Secondary | ICD-10-CM

## 2020-11-20 DIAGNOSIS — E1159 Type 2 diabetes mellitus with other circulatory complications: Secondary | ICD-10-CM

## 2020-11-20 DIAGNOSIS — I152 Hypertension secondary to endocrine disorders: Secondary | ICD-10-CM

## 2020-11-20 DIAGNOSIS — Z0000 Encounter for general adult medical examination without abnormal findings: Secondary | ICD-10-CM | POA: Diagnosis not present

## 2020-11-20 DIAGNOSIS — Z794 Long term (current) use of insulin: Secondary | ICD-10-CM

## 2020-11-20 DIAGNOSIS — E1169 Type 2 diabetes mellitus with other specified complication: Secondary | ICD-10-CM | POA: Diagnosis not present

## 2020-11-20 DIAGNOSIS — E119 Type 2 diabetes mellitus without complications: Secondary | ICD-10-CM

## 2020-11-20 DIAGNOSIS — C61 Malignant neoplasm of prostate: Secondary | ICD-10-CM

## 2020-11-20 MED ORDER — LISINOPRIL 40 MG PO TABS: 40.0000 mg | ORAL_TABLET | Freq: Every day | ORAL | 3 refills | Status: DC

## 2020-11-20 MED ORDER — ATORVASTATIN CALCIUM 80 MG PO TABS: 80.0000 mg | ORAL_TABLET | Freq: Every day | ORAL | 3 refills | Status: DC

## 2020-11-20 NOTE — Patient Instructions (Signed)
General Preventive Care  Most recent routine screening labs: ordered.   Blood pressure goal 130/80 or less.   Tobacco: don't! Please let me know if you need help quitting!  Alcohol: responsible moderation is ok for most adults - if you have concerns about your alcohol intake, please talk to me!   Exercise: as tolerated to reduce risk of cardiovascular disease and diabetes. Strength training will also prevent osteoporosis.   Mental health: if need for mental health care (medicines, counseling, other), or concerns about moods, please let me know!   Sexual / Reproductive health: if need for STD testing, or if concerns with libido/pain problems, please let me know!  Advanced Directive: Living Will and/or Healthcare Power of Attorney recommended for all adults, regardless of age or health.  Vaccines  Flu vaccine: for almost everyone, every fall.   Shingles vaccine: after age 33.   Pneumonia vaccines: booster after age 59  Tetanus booster: due 2026  COVID vaccine: THANKS for getting your vaccine! :) BOOSTER STRONGLY RECOMMENDED BEFORE UNDERGOING RADIATION Cancer screenings   Colon cancer screening: for everyone age 64-75. Colonoscopy available for all, many people also qualify for the Cologuard stool test   Prostate cancer treatment per HemOnc/Urology Infection screenings  . HIV: recommended screening at least once age 19-65 . Gonorrhea/Chlamydia: screening as needed . Hepatitis C: recommended once for everyone age 41-75 . TB: certain at-risk populations, or depending on work requirements and/or travel history Other . Bone Density Test: recommended for men at age 47 . Abdominal Aortic Aneurysm: screening with ultrasound recommended once for men age 18-75 who have ever smoked

## 2020-11-20 NOTE — Progress Notes (Signed)
Jesse Calhoun is a 65 y.o. male who presents to  [place] at [facility]  today, 11/20/20, seeking care for the following:  . Annual physical  . Follow up diabetes, hypertension  . Due for labs  BP Readings from Last 3 Encounters:  11/20/20 (!) 146/61  09/01/20 (!) 105/59  08/22/20 (!) 154/80   Wt Readings from Last 3 Encounters:  11/20/20 231 lb (104.8 kg)  09/01/20 231 lb 3.2 oz (104.9 kg)  08/22/20 229 lb (103.9 kg)      ASSESSMENT & PLAN with other pertinent findings:  The primary encounter diagnosis was Annual physical exam. Diagnoses of Type 2 diabetes mellitus without complication, with long-term current use of insulin ([place]), Hypertension associated with type 2 diabetes mellitus ([place]), Hyperlipidemia associated with type 2 diabetes mellitus ([place]), and Prostate cancer ([place]) were also pertinent to this visit.    Patient Instructions  General Preventive Care  Most recent routine screening labs: ordered.   Blood pressure goal 130/80 or less.   Tobacco: don't! Please let me know if you need help quitting!  Alcohol: responsible moderation is ok for most adults - if you have concerns about your alcohol intake, please talk to me!   Exercise: as tolerated to reduce risk of cardiovascular disease and diabetes. Strength training will also prevent osteoporosis.   Mental health: if need for mental health care (medicines, counseling, other), or concerns about moods, please let me know!   Sexual / Reproductive health: if need for STD testing, or if concerns with libido/pain problems, please let me know!  Advanced Directive: Living Will and/or Healthcare Power of Attorney recommended for all adults, regardless of age or health.  Vaccines  Flu vaccine: for almost everyone, every fall.   Shingles vaccine: after age 39.   Pneumonia vaccines: booster after age 24  Tetanus booster: due 2026  COVID vaccine: THANKS for getting your  vaccine! :) BOOSTER STRONGLY RECOMMENDED BEFORE UNDERGOING RADIATION Cancer screenings   Colon cancer screening: for everyone age 25-75. Colonoscopy available for all, many people also qualify for the Cologuard stool test   Prostate cancer treatment per HemOnc/Urology Infection screenings  . HIV: recommended screening at least once age 26-65 . Gonorrhea/Chlamydia: screening as needed . Hepatitis C: recommended once for everyone age 32-75 . TB: certain at-risk populations, or depending on work requirements and/or travel history Other . Bone Density Test: recommended for men at age 72 . Abdominal Aortic Aneurysm: screening with ultrasound recommended once for men age 54-75 who have ever smoked    Orders Placed This Encounter  Procedures  . CBC  . COMPLETE METABOLIC PANEL WITH GFR  . Lipid panel  . Hemoglobin A1c    Meds ordered this encounter  Medications  . lisinopril (ZESTRIL) 40 MG tablet    Sig: Take 1 tablet (40 mg total) by mouth daily.    Dispense:  90 tablet    Refill:  3  . atorvastatin (LIPITOR) 80 MG tablet    Sig: Take 1 tablet (80 mg total) by mouth daily.    Dispense:  90 tablet    Refill:  3     See below for relevant physical exam findings  See below for recent lab and imaging results reviewed  Medications, allergies, PMH, PSH, SocH, FamH reviewed below    Follow-up instructions: Return in about 3 months (around 02/20/2021) for MONITOR A1C, SEE [place] SOONER IF NEEDED .  Exam:  BP (!) 146/61 (BP Location: Left Arm, Patient Position: Sitting, Cuff Size: Large)   Pulse 68   Temp 98.1 F (36.7 C) (Oral)   Wt 231 lb (104.8 kg)   BMI 35.12 kg/m   Constitutional: VS see above. General Appearance: alert, well-developed, well-nourished, NAD  Neck: No masses, trachea midline.   Respiratory: Normal respiratory effort. no wheeze, no rhonchi, no rales  Cardiovascular: S1/S2 normal, no  murmur, no rub/gallop auscultated. RRR.   Musculoskeletal: Gait normal. Symmetric and independent movement of all extremities  Neurological: Normal balance/coordination. No tremor.  Skin: warm, dry, intact.   Psychiatric: Normal judgment/insight. Normal mood and affect. Oriented x3.   Current Meds  Medication Sig  . ACCU-CHEK AVIVA PLUS test strip USE ONE STRIP TO TEST TWICE A DAY  . Accu-Chek Softclix Lancets lancets USE TO CHECK BLOOD SUGAR TWICE DAILY AS DIRECTED  . Insulin Glargine (BASAGLAR KWIKPEN) 100 UNIT/ML Inject 0.55 mLs (55 Units total) into the skin daily.  . Insulin Pen Needle (B-D ULTRAFINE III SHORT PEN) 31G X 8 MM MISC USE DAILY AS DIRECTED  . ofloxacin (OCUFLOX) 0.3 % ophthalmic solution   . sildenafil (REVATIO) 20 MG tablet Take 1-5 tablets (20-100 mg total) by mouth as needed (prior to sex).  . [DISCONTINUED] atorvastatin (LIPITOR) 80 MG tablet Take 1 tablet (80 mg total) by mouth daily.  . [DISCONTINUED] lisinopril (ZESTRIL) 40 MG tablet Take 1 tablet (40 mg total) by mouth daily.    No Known Allergies  Patient Active Problem List   Diagnosis Date Noted  . Hyperlipidemia associated with type 2 diabetes mellitus ([place]) 11/20/2020  . Prostate cancer ([place]) 05/23/2020  . Elevated PSA 02/08/2020  . 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 Type 2 diabetes mellitus without complication, with long-term current use of insulin ([place]) 07/21/2015  . Hyperlipidemia 03/26/2015  . Hoarseness of voice 03/19/2015  . Essential hypertension 03/19/2015    Family History  Problem Relation Age of Onset  . Diabetes Mother   . Cancer Father        unknown type "possibly lung"  . Breast cancer Neg Hx   . Prostate cancer Neg Hx   . Colon cancer Neg Hx   . Pancreatic cancer Neg Hx     Social History   Tobacco Use  Smoking Status Heavy Tobacco Smoker  . Packs/day: 5.00  . Years: 0.00  . Pack years: 0.00  . Types: Cigars  Smokeless Tobacco Never Used  Tobacco Comment    smokes approximately 5 cigars per day    Past Surgical History:  Procedure Laterality Date  . KNEE SURGERY     age 89  . PROSTATE BIOPSY      Immunization History  Administered Date(s) Administered  . Influenza,inj,Quad PF,6+ Mos 06/03/2015, 02/22/2020  . Influenza-Unspecified 04/15/2016, 04/15/2017, 04/15/2018  . PFIZER(Purple Top)SARS-COV-2 Vaccination 09/05/2019, 09/26/2019, 04/04/2020  . Pneumococcal Polysaccharide-23 06/03/2015  . Tdap 06/03/2015    Recent Results (from the past 2160 hour(s))  CBC     Status: None   Collection Time: 11/20/20  8:44 AM  Result Value Ref Range   WBC 9.0 3.8 - 10.8 Thousand/uL   RBC 4.92 4.20 - 5.80 Million/uL   Hemoglobin 15.0 13.2 - 17.1 g/dL   HCT 44.1 38.5 - 50.0 %   MCV 89.6 80.0 - 100.0 fL   MCH 30.5 27.0 - 33.0 pg   MCHC 34.0 32.0 - 36.0 g/dL   RDW 12.6 11.0 - 15.0 %   Platelets 191 140 -  400 Thousand/uL   MPV 12.3 7.5 - 12.5 fL    No results found.     All questions at time of visit were answered - patient instructed to contact office with any additional concerns or updates. ER/RTC precautions were reviewed with the patient as applicable.   Please note: manual typing as well as voice recognition software may have been used to produce this document - typos may escape review. Please contact Dr. Sheppard Coil for any needed clarifications.

## 2020-11-21 LAB — COMPLETE METABOLIC PANEL WITH GFR
AG Ratio: 1.5 (calc) (ref 1.0–2.5)
ALT: 19 U/L (ref 9–46)
AST: 14 U/L (ref 10–35)
Albumin: 4.2 g/dL (ref 3.6–5.1)
Alkaline phosphatase (APISO): 70 U/L (ref 35–144)
BUN/Creatinine Ratio: 26 (calc) — ABNORMAL HIGH (ref 6–22)
BUN: 45 mg/dL — ABNORMAL HIGH (ref 7–25)
CO2: 26 mmol/L (ref 20–32)
Calcium: 9.6 mg/dL (ref 8.6–10.3)
Chloride: 105 mmol/L (ref 98–110)
Creat: 1.72 mg/dL — ABNORMAL HIGH (ref 0.70–1.25)
GFR, Est African American: 48 mL/min/{1.73_m2} — ABNORMAL LOW (ref >=60)
GFR, Est Non African American: 41 mL/min/{1.73_m2} — ABNORMAL LOW (ref >=60)
Globulin: 2.8 g/dL (calc) (ref 1.9–3.7)
Glucose, Bld: 148 mg/dL — ABNORMAL HIGH (ref 65–99)
Potassium: 5.5 mmol/L — ABNORMAL HIGH (ref 3.5–5.3)
Sodium: 138 mmol/L (ref 135–146)
Total Bilirubin: 0.4 mg/dL (ref 0.2–1.2)
Total Protein: 7 g/dL (ref 6.1–8.1)

## 2020-11-21 LAB — LIPID PANEL
Cholesterol: 138 mg/dL (ref <200)
HDL: 28 mg/dL — ABNORMAL LOW (ref >=40)
LDL Cholesterol (Calc): 76 mg/dL (calc)
Non-HDL Cholesterol (Calc): 110 mg/dL (calc) (ref <130)
Total CHOL/HDL Ratio: 4.9 (calc) (ref <5.0)
Triglycerides: 245 mg/dL — ABNORMAL HIGH (ref <150)

## 2020-11-21 LAB — CBC
HCT: 44.1 % (ref 38.5–50.0)
Hemoglobin: 15 g/dL (ref 13.2–17.1)
MCH: 30.5 pg (ref 27.0–33.0)
MCHC: 34 g/dL (ref 32.0–36.0)
MCV: 89.6 fL (ref 80.0–100.0)
MPV: 12.3 fL (ref 7.5–12.5)
Platelets: 191 10*3/uL (ref 140–400)
RBC: 4.92 10*6/uL (ref 4.20–5.80)
RDW: 12.6 % (ref 11.0–15.0)
WBC: 9 10*3/uL (ref 3.8–10.8)

## 2020-11-21 LAB — HEMOGLOBIN A1C
Hgb A1c MFr Bld: 8.3 % of total Hgb — ABNORMAL HIGH (ref <5.7)
Mean Plasma Glucose: 192 mg/dL
eAG (mmol/L): 10.6 mmol/L

## 2020-12-11 ENCOUNTER — Telehealth: Payer: Self-pay | Admitting: *Deleted

## 2020-12-11 NOTE — Telephone Encounter (Signed)
Connected with Malachi Kinzler [phone]) for further information to complete FMLA form.  Message left with call information for radiation nurse and patient requesting return call from radiation regarding treatment schedule.    "Do you know Tammy?  Week before last I spoke with her.  She said she would get back with me.   [facility] placement is scheduled January 21, 2021 with [facility].   Expecting a seat fitting and eight weeks of radiotherapy appointments to be scheduled at your office.   Unavailable to begin radiation until 02/09/2021 due to vacation beginning February 01, 2021.   Intend to continue working during radiation treatments.   I drive from [place], [place] daily for [facility] appointments.   Normal work hours are 8:00 am to 5:00 pm.   Early morning radiation appointments from 8:00 am to 9:00 am work best.  Will not need to be up as early as 5:00 am for appointment arrival and able to leave with time to drive in to work in [place] after treatment. 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  Expressed to notify employer and [facility] if changes needed.

## 2020-12-12 ENCOUNTER — Telehealth: Payer: Self-pay | Admitting: *Deleted

## 2020-12-12 NOTE — Telephone Encounter (Signed)
CALLED PATIENT TO INFORM OF SIM APPT. ON 01-23-21- ARRIVAL TIME- 9:45 AM @ [facility], SPOKE WITH PATIENT AND HE IS AWARE OF THIS APPT.

## 2020-12-14 ENCOUNTER — Other Ambulatory Visit: Payer: Self-pay | Admitting: Osteopathic Medicine

## 2021-01-20 ENCOUNTER — Telehealth: Payer: Self-pay | Admitting: *Deleted

## 2021-01-20 NOTE — Telephone Encounter (Signed)
RETURNED PATIENT'S PHONE CALL, SPOKE WITH PATIENT. ?

## 2021-01-22 ENCOUNTER — Telehealth: Payer: Self-pay | Admitting: *Deleted

## 2021-01-22 NOTE — Telephone Encounter (Signed)
CALLED PATIENT TO REMIND OF SIM APPT. FOR 01-23-21- ARRIVAL TIME- 9:45 AM, SPOKE WITH PATIENT AND HE IS AWARE OF THIS APPT.

## 2021-01-23 ENCOUNTER — Ambulatory Visit
Admission: RE | Admit: 2021-01-23 | Discharge: 2021-01-23 | Disposition: A | Discharge status: Home / Self Care | Payer: 59 | Source: Ambulatory Visit | Attending: Radiation Oncology | Admitting: Radiation Oncology

## 2021-01-23 ENCOUNTER — Other Ambulatory Visit: Payer: Self-pay

## 2021-01-23 DIAGNOSIS — C61 Malignant neoplasm of prostate: Secondary | ICD-10-CM | POA: Diagnosis not present

## 2021-01-23 NOTE — Progress Notes (Signed)
  Radiation Oncology         [phone] ________________________________  Name: Jesse Calhoun MRN: [id number]  Date: 01/23/2021  DOB: 1955-07-12  SIMULATION AND TREATMENT PLANNING NOTE    ICD-10-CM   1. Prostate cancer [facility])  C61       DIAGNOSIS:  65 y.o. gentleman with stage T2a adenocarcinoma of the prostate with a Gleason's score of 4+4 and a PSA of 5.5  NARRATIVE:  The patient was brought to the [place].  Identity was confirmed.  All relevant records and images related to the planned course of therapy were reviewed.  The patient freely provided informed written consent to proceed with treatment after reviewing the details related to the planned course of therapy. The consent form was witnessed and verified by the simulation staff.  Then, the patient was set-up in a stable reproducible supine position for radiation therapy.  A vacuum lock pillow device was custom fabricated to position his legs in a reproducible immobilized position.  Then, I performed a urethrogram under sterile conditions to identify the prostatic apex.  CT images were obtained.  Surface markings were placed.  The CT images were loaded into the planning software.  Then the prostate target and avoidance structures including the rectum, bladder, bowel and hips were contoured.  Treatment planning then occurred.  The radiation prescription was entered and confirmed.  A total of one complex treatment devices was fabricated. I have requested : Intensity Modulated Radiotherapy (IMRT) is medically necessary for this case for the following reason:  Rectal sparing.Marland Kitchen  PLAN:  The prostate, seminal vesicles, and pelvic lymph nodes will be treated to 45 Gy in 25 fractions of 1.8 Gy followed by a boost to 75 Gy with 15 additional fractions of 2.0 Gy to the prostate only.  ________________________________  Sheral Apley Tammi Klippel, M.D.

## 2021-02-02 DIAGNOSIS — C61 Malignant neoplasm of prostate: Secondary | ICD-10-CM | POA: Diagnosis present

## 2021-02-02 DIAGNOSIS — Z510 Encounter for antineoplastic radiation therapy: Secondary | ICD-10-CM | POA: Insufficient documentation

## 2021-02-10 ENCOUNTER — Other Ambulatory Visit: Payer: Self-pay

## 2021-02-10 ENCOUNTER — Ambulatory Visit
Admission: RE | Admit: 2021-02-10 | Discharge: 2021-02-10 | Disposition: A | Discharge status: Home / Self Care | Payer: 59 | Source: Ambulatory Visit | Attending: Radiation Oncology | Admitting: Radiation Oncology

## 2021-02-10 DIAGNOSIS — C61 Malignant neoplasm of prostate: Secondary | ICD-10-CM | POA: Diagnosis not present

## 2021-02-11 ENCOUNTER — Ambulatory Visit
Admission: RE | Admit: 2021-02-11 | Discharge: 2021-02-11 | Disposition: A | Discharge status: Home / Self Care | Payer: 59 | Source: Ambulatory Visit | Attending: Radiation Oncology | Admitting: Radiation Oncology

## 2021-02-11 DIAGNOSIS — C61 Malignant neoplasm of prostate: Secondary | ICD-10-CM | POA: Diagnosis not present

## 2021-02-12 ENCOUNTER — Ambulatory Visit
Admission: RE | Admit: 2021-02-12 | Discharge: 2021-02-12 | Disposition: A | Discharge status: Home / Self Care | Payer: 59 | Source: Ambulatory Visit | Attending: Radiation Oncology | Admitting: Radiation Oncology

## 2021-02-12 ENCOUNTER — Other Ambulatory Visit: Payer: Self-pay

## 2021-02-12 DIAGNOSIS — C61 Malignant neoplasm of prostate: Secondary | ICD-10-CM | POA: Diagnosis not present

## 2021-02-12 NOTE — Progress Notes (Signed)
Pt here for patient teaching.  Pt given Radiation and You booklet and skin care instructions.  Reviewed areas of pertinence such as diarrhea, fatigue, hair loss, nausea and vomiting, sexual and fertility changes, skin changes, and urinary and bladder changes . Pt able to give teach back of to pat skin, use unscented/gentle soap, use baby wipes, have Imodium on hand, drink plenty of water, and sitz bath,avoid applying anything to skin within 4 hours of treatment. Pt verbalizes understanding of information given and will contact nursing with any questions or concerns.     [email or web address]  Gloriajean Dell. Leonie Green, BSN

## 2021-02-13 ENCOUNTER — Ambulatory Visit
Admission: RE | Admit: 2021-02-13 | Discharge: 2021-02-13 | Disposition: A | Discharge status: Home / Self Care | Payer: 59 | Source: Ambulatory Visit | Attending: Radiation Oncology | Admitting: Radiation Oncology

## 2021-02-13 DIAGNOSIS — C61 Malignant neoplasm of prostate: Secondary | ICD-10-CM | POA: Diagnosis not present

## 2021-02-14 ENCOUNTER — Other Ambulatory Visit: Payer: Self-pay | Admitting: Osteopathic Medicine

## 2021-02-14 DIAGNOSIS — E119 Type 2 diabetes mellitus without complications: Secondary | ICD-10-CM

## 2021-02-14 DIAGNOSIS — Z794 Long term (current) use of insulin: Secondary | ICD-10-CM

## 2021-02-16 ENCOUNTER — Other Ambulatory Visit: Payer: Self-pay

## 2021-02-16 ENCOUNTER — Ambulatory Visit
Admission: RE | Admit: 2021-02-16 | Discharge: 2021-02-16 | Disposition: A | Discharge status: Home / Self Care | Payer: 59 | Source: Ambulatory Visit | Attending: Radiation Oncology | Admitting: Radiation Oncology

## 2021-02-16 DIAGNOSIS — C61 Malignant neoplasm of prostate: Secondary | ICD-10-CM | POA: Diagnosis not present

## 2021-02-17 ENCOUNTER — Ambulatory Visit
Admission: RE | Admit: 2021-02-17 | Discharge: 2021-02-17 | Disposition: A | Discharge status: Home / Self Care | Payer: 59 | Source: Ambulatory Visit | Attending: Radiation Oncology | Admitting: Radiation Oncology

## 2021-02-17 DIAGNOSIS — C61 Malignant neoplasm of prostate: Secondary | ICD-10-CM | POA: Diagnosis not present

## 2021-02-18 ENCOUNTER — Ambulatory Visit
Admission: RE | Admit: 2021-02-18 | Discharge: 2021-02-18 | Disposition: A | Discharge status: Home / Self Care | Payer: 59 | Source: Ambulatory Visit | Attending: Radiation Oncology | Admitting: Radiation Oncology

## 2021-02-18 ENCOUNTER — Other Ambulatory Visit: Payer: Self-pay

## 2021-02-18 DIAGNOSIS — C61 Malignant neoplasm of prostate: Secondary | ICD-10-CM | POA: Diagnosis not present

## 2021-02-19 ENCOUNTER — Ambulatory Visit (INDEPENDENT_AMBULATORY_CARE_PROVIDER_SITE_OTHER): Payer: 59 | Admitting: Osteopathic Medicine

## 2021-02-19 ENCOUNTER — Encounter: Payer: Self-pay | Admitting: Osteopathic Medicine

## 2021-02-19 ENCOUNTER — Ambulatory Visit
Admission: RE | Admit: 2021-02-19 | Discharge: 2021-02-19 | Disposition: A | Discharge status: Home / Self Care | Payer: 59 | Source: Ambulatory Visit | Attending: Radiation Oncology | Admitting: Radiation Oncology

## 2021-02-19 VITALS — BP 111/67 | HR 86 | Temp 97.8°F | Wt 234.1 lb

## 2021-02-19 DIAGNOSIS — Z794 Long term (current) use of insulin: Secondary | ICD-10-CM

## 2021-02-19 DIAGNOSIS — E119 Type 2 diabetes mellitus without complications: Secondary | ICD-10-CM | POA: Diagnosis not present

## 2021-02-19 DIAGNOSIS — L989 Disorder of the skin and subcutaneous tissue, unspecified: Secondary | ICD-10-CM | POA: Diagnosis not present

## 2021-02-19 DIAGNOSIS — C61 Malignant neoplasm of prostate: Secondary | ICD-10-CM

## 2021-02-19 DIAGNOSIS — R195 Other fecal abnormalities: Secondary | ICD-10-CM

## 2021-02-19 LAB — POCT GLYCOSYLATED HEMOGLOBIN (HGB A1C): Hemoglobin A1C: 9.1 % — AB (ref 4.0–5.6)

## 2021-02-19 MED ORDER — TERBINAFINE HCL 1 % EX CREA: 1.0000 "application " | TOPICAL_CREAM | Freq: Two times a day (BID) | CUTANEOUS | 1 refills | Status: DC

## 2021-02-19 MED ORDER — OZEMPIC (0.25 OR 0.5 MG/DOSE) 2 MG/1.5ML ~~LOC~~ SOPN: PEN_INJECTOR | SUBCUTANEOUS | 0 refills | Status: DC

## 2021-02-19 NOTE — Patient Instructions (Addendum)
Plan: START Ozempic, low dose for first 2-4 weeks at 0.25 mg weekly then increase to 0.5 mg weekly  CONTINUE insulin as-is for now, may try going up to target fasting sugar 100-130, may be able to go down on this as we go up on Ozmepic  Sent cream for foot Imodium: 4 mg, followed by 2 mg after each loose stool; maximum: 16 mg/day

## 2021-02-19 NOTE — Progress Notes (Signed)
Jesse Calhoun is a 65 y.o. male who presents to  [place] at [facility]  today, 02/19/21, seeking care for the following:  Very pleasant patient here to follow-up on a few chronic conditions and new concern about skin on right foot  DM2 follow-up R foot has scab he'd like checked out  Doing ok w/ prostate CA radiation, had some diarrhea and questions about OTC meds      ASSESSMENT & PLAN with other pertinent findings:  The primary encounter diagnosis was Type 2 diabetes mellitus without complication, with long-term current use of insulin ([place]). Diagnoses of Skin abnormality: mild cracking on sole of foot Ddx dry skin vs tinea pedis, Prostate cancer ([place]), and Loose stools were also pertinent to this visit.   1. Type 2 diabetes mellitus without complication, with long-term current use of insulin ([facility]) A1c creeping up, he is doing about all he can in terms of diet/exercise.  Option to continue to titrate up on insulin but I would like to start on Ozempic or similar to see if we can help with weight loss, A1c control, possibly even reduce insulin.  2. Skin abnormality: mild cracking on sole of foot Ddx dry skin vs tinea pedis Terbinafine cream sent as below  3. Prostate cancer ([place]) 4. Loose stools Not sure how much loose stools are a common side effect of prostate radiation, advised him to check with his cancer specialist if any concerns, reviewed dosage of Imodium as below   Patient Instructions  Plan: START Ozempic, low dose for first 2-4 weeks at 0.25 mg weekly then increase to 0.5 mg weekly  CONTINUE insulin as-is for now, may try going up to target fasting sugar 100-130, may be able to go down on this as we go up on Ozmepic  Sent cream for foot Imodium: 4 mg, followed by 2 mg after each loose stool; maximum: 16 mg/day   Orders Placed This Encounter  Procedures   POCT HgB A1C    Meds ordered this encounter  Medications    terbinafine (LAMISIL) 1 % cream    Sig: Apply 1 application topically 2 (two) times daily. Until skin issue resolves, then continue for 1 more week after that    Dispense:  36 g    Refill:  1   Semaglutide,0.25 or 0.'5MG'$ /DOS, (OZEMPIC, 0.25 OR 0.5 MG/DOSE,) 2 MG/1.5ML SOPN    Sig: Inject 0.25 mg into the skin once a week for 30 days, THEN 0.5 mg once a week.    Dispense:  7.5 mL    Refill:  0     See below for relevant physical exam findings  See below for recent lab and imaging results reviewed  Medications, allergies, PMH, PSH, SocH, FamH reviewed below    Follow-up instructions: Return in about 3 months (around 05/22/2021) for FOLLOW UP WITH DR MATTHEWS - MONITOR A1C .                                         Exam:  BP 111/67 (BP Location: Left Arm, Patient Position: Sitting, Cuff Size: Normal)   Pulse 86   Temp 97.8 F (36.6 C) (Oral)   Wt 234 lb 1.3 oz (106.2 kg)   BMI 35.59 kg/m  Constitutional: VS see above. General Appearance: alert, well-developed, well-nourished, NAD Neck: No masses, trachea midline.  Respiratory: Normal respiratory effort. no wheeze,  no rhonchi, no rales Cardiovascular: S1/S2 normal, no murmur, no rub/gallop auscultated. RRR.  Musculoskeletal: Gait normal. Symmetric and independent movement of all extremities Abdominal: non-tender, non-distended, no appreciable organomegaly, neg Murphy's, BS WNLx4 Neurological: Normal balance/coordination. No tremor. Skin: warm, dry, intact.  Psychiatric: Normal judgment/insight. Normal mood and affect. Oriented x3.   Current Meds  Medication Sig   ACCU-CHEK AVIVA PLUS test strip USE ONE STRIP TO TEST TWICE A DAY   Accu-Chek Softclix Lancets lancets CHECK BLOOD SUGARS TWO TIMES A DAY   atorvastatin (LIPITOR) 80 MG tablet Take 1 tablet (80 mg total) by mouth daily.   B-D ULTRAFINE III SHORT PEN 31G X 8 MM MISC USE DAILY AS DIRECTED   Insulin Glargine (BASAGLAR KWIKPEN) 100  UNIT/ML Inject 0.55 mLs (55 Units total) into the skin daily.   lisinopril (ZESTRIL) 40 MG tablet Take 1 tablet (40 mg total) by mouth daily.   ofloxacin (OCUFLOX) 0.3 % ophthalmic solution    Semaglutide,0.25 or 0.'5MG'$ /DOS, (OZEMPIC, 0.25 OR 0.5 MG/DOSE,) 2 MG/1.5ML SOPN Inject 0.25 mg into the skin once a week for 30 days, THEN 0.5 mg once a week.   sildenafil (REVATIO) 20 MG tablet Take 1-5 tablets (20-100 mg total) by mouth as needed (prior to sex).   terbinafine (LAMISIL) 1 % cream Apply 1 application topically 2 (two) times daily. Until skin issue resolves, then continue for 1 more week after that    No Known Allergies  Patient Active Problem List   Diagnosis Date Noted   Hyperlipidemia associated with type 2 diabetes mellitus ([place]) 11/20/2020   Prostate cancer ([place]) 05/23/2020   Elevated PSA 02/08/2020   Senile nuclear sclerosis 11/26/2015   Type 2 diabetes mellitus without complication, with long-term current use of insulin ([place]) 07/21/2015   Hyperlipidemia 03/26/2015   Hoarseness of voice 03/19/2015   Essential hypertension 03/19/2015    Family History  Problem Relation Age of Onset   Diabetes Mother    Cancer Father        unknown type "possibly lung"   Breast cancer Neg Hx    Prostate cancer Neg Hx    Colon cancer Neg Hx    Pancreatic cancer Neg Hx     Social History   Tobacco Use  Smoking Status Heavy Smoker   Packs/day: 5.00   Years: 0.00   Pack years: 0.00   Types: Cigars, Cigarettes  Smokeless Tobacco Never  Tobacco Comments   smokes approximately 5 cigars per day    Past Surgical History:  Procedure Laterality Date   KNEE SURGERY     age 19   PROSTATE BIOPSY      Immunization History  Administered Date(s) Administered   Influenza,inj,Quad PF,6+ Mos 06/03/2015, 02/22/2020   Influenza-Unspecified 04/15/2016, 04/15/2017, 04/15/2018   PFIZER(Purple Top)SARS-COV-2 Vaccination 09/05/2019, 09/26/2019, 04/04/2020   Pneumococcal Polysaccharide-23  06/03/2015   Tdap 06/03/2015    Recent Results (from the past 2160 hour(s))  POCT HgB A1C     Status: Abnormal   Collection Time: 02/19/21  1:51 PM  Result Value Ref Range   Hemoglobin A1C 9.1 (A) 4.0 - 5.6 %   HbA1c POC (<> result, manual entry)     HbA1c, POC (prediabetic range)     HbA1c, POC (controlled diabetic range)      No results found.     All questions at time of visit were answered - patient instructed to contact office with any additional concerns or updates. ER/RTC precautions were reviewed with the patient as applicable.  Please note: manual typing as well as voice recognition software may have been used to produce this document - typos may escape review. Please contact Dr. Sheppard Coil for any needed clarifications.

## 2021-02-20 ENCOUNTER — Ambulatory Visit: Payer: 59 | Admitting: Osteopathic Medicine

## 2021-02-20 ENCOUNTER — Ambulatory Visit
Admission: RE | Admit: 2021-02-20 | Discharge: 2021-02-20 | Disposition: A | Discharge status: Home / Self Care | Payer: 59 | Source: Ambulatory Visit | Attending: Radiation Oncology | Admitting: Radiation Oncology

## 2021-02-20 ENCOUNTER — Other Ambulatory Visit: Payer: Self-pay

## 2021-02-20 DIAGNOSIS — C61 Malignant neoplasm of prostate: Secondary | ICD-10-CM | POA: Diagnosis not present

## 2021-02-23 ENCOUNTER — Other Ambulatory Visit: Payer: Self-pay

## 2021-02-23 ENCOUNTER — Ambulatory Visit
Admission: RE | Admit: 2021-02-23 | Discharge: 2021-02-23 | Disposition: A | Discharge status: Home / Self Care | Payer: 59 | Source: Ambulatory Visit | Attending: Radiation Oncology | Admitting: Radiation Oncology

## 2021-02-23 DIAGNOSIS — C61 Malignant neoplasm of prostate: Secondary | ICD-10-CM | POA: Diagnosis not present

## 2021-02-24 ENCOUNTER — Ambulatory Visit
Admission: RE | Admit: 2021-02-24 | Discharge: 2021-02-24 | Disposition: A | Discharge status: Home / Self Care | Payer: 59 | Source: Ambulatory Visit | Attending: Radiation Oncology | Admitting: Radiation Oncology

## 2021-02-24 DIAGNOSIS — C61 Malignant neoplasm of prostate: Secondary | ICD-10-CM | POA: Diagnosis not present

## 2021-02-25 ENCOUNTER — Other Ambulatory Visit: Payer: Self-pay

## 2021-02-25 ENCOUNTER — Ambulatory Visit
Admission: RE | Admit: 2021-02-25 | Discharge: 2021-02-25 | Disposition: A | Discharge status: Home / Self Care | Payer: 59 | Source: Ambulatory Visit | Attending: Radiation Oncology | Admitting: Radiation Oncology

## 2021-02-25 DIAGNOSIS — C61 Malignant neoplasm of prostate: Secondary | ICD-10-CM | POA: Diagnosis not present

## 2021-02-26 ENCOUNTER — Ambulatory Visit
Admission: RE | Admit: 2021-02-26 | Discharge: 2021-02-26 | Disposition: A | Discharge status: Home / Self Care | Payer: 59 | Source: Ambulatory Visit | Attending: Radiation Oncology | Admitting: Radiation Oncology

## 2021-02-26 DIAGNOSIS — C61 Malignant neoplasm of prostate: Secondary | ICD-10-CM | POA: Diagnosis not present

## 2021-02-27 ENCOUNTER — Ambulatory Visit
Admission: RE | Admit: 2021-02-27 | Discharge: 2021-02-27 | Disposition: A | Discharge status: Home / Self Care | Payer: 59 | Source: Ambulatory Visit | Attending: Radiation Oncology | Admitting: Radiation Oncology

## 2021-02-27 ENCOUNTER — Other Ambulatory Visit: Payer: Self-pay

## 2021-02-27 DIAGNOSIS — C61 Malignant neoplasm of prostate: Secondary | ICD-10-CM | POA: Diagnosis not present

## 2021-03-02 ENCOUNTER — Ambulatory Visit
Admission: RE | Admit: 2021-03-02 | Discharge: 2021-03-02 | Disposition: A | Discharge status: Home / Self Care | Payer: 59 | Source: Ambulatory Visit | Attending: Radiation Oncology | Admitting: Radiation Oncology

## 2021-03-02 ENCOUNTER — Other Ambulatory Visit: Payer: Self-pay

## 2021-03-02 DIAGNOSIS — C61 Malignant neoplasm of prostate: Secondary | ICD-10-CM | POA: Diagnosis not present

## 2021-03-03 ENCOUNTER — Ambulatory Visit
Admission: RE | Admit: 2021-03-03 | Discharge: 2021-03-03 | Disposition: A | Discharge status: Home / Self Care | Payer: 59 | Source: Ambulatory Visit | Attending: Radiation Oncology | Admitting: Radiation Oncology

## 2021-03-03 DIAGNOSIS — C61 Malignant neoplasm of prostate: Secondary | ICD-10-CM | POA: Diagnosis not present

## 2021-03-04 ENCOUNTER — Other Ambulatory Visit: Payer: Self-pay

## 2021-03-04 ENCOUNTER — Ambulatory Visit
Admission: RE | Admit: 2021-03-04 | Discharge: 2021-03-04 | Disposition: A | Discharge status: Home / Self Care | Payer: 59 | Source: Ambulatory Visit | Attending: Radiation Oncology | Admitting: Radiation Oncology

## 2021-03-04 DIAGNOSIS — C61 Malignant neoplasm of prostate: Secondary | ICD-10-CM | POA: Diagnosis not present

## 2021-03-05 ENCOUNTER — Ambulatory Visit
Admission: RE | Admit: 2021-03-05 | Discharge: 2021-03-05 | Disposition: A | Discharge status: Home / Self Care | Payer: 59 | Source: Ambulatory Visit | Attending: Radiation Oncology | Admitting: Radiation Oncology

## 2021-03-05 DIAGNOSIS — C61 Malignant neoplasm of prostate: Secondary | ICD-10-CM | POA: Insufficient documentation

## 2021-03-05 DIAGNOSIS — Z510 Encounter for antineoplastic radiation therapy: Secondary | ICD-10-CM | POA: Insufficient documentation

## 2021-03-06 ENCOUNTER — Other Ambulatory Visit: Payer: Self-pay

## 2021-03-06 ENCOUNTER — Ambulatory Visit
Admission: RE | Admit: 2021-03-06 | Discharge: 2021-03-06 | Disposition: A | Discharge status: Home / Self Care | Payer: 59 | Source: Ambulatory Visit | Attending: Radiation Oncology | Admitting: Radiation Oncology

## 2021-03-06 DIAGNOSIS — C61 Malignant neoplasm of prostate: Secondary | ICD-10-CM | POA: Diagnosis not present

## 2021-03-10 ENCOUNTER — Other Ambulatory Visit: Payer: Self-pay

## 2021-03-10 ENCOUNTER — Ambulatory Visit
Admission: RE | Admit: 2021-03-10 | Discharge: 2021-03-10 | Disposition: A | Discharge status: Home / Self Care | Payer: 59 | Source: Ambulatory Visit | Attending: Radiation Oncology | Admitting: Radiation Oncology

## 2021-03-10 DIAGNOSIS — C61 Malignant neoplasm of prostate: Secondary | ICD-10-CM | POA: Diagnosis not present

## 2021-03-11 ENCOUNTER — Other Ambulatory Visit: Payer: Self-pay

## 2021-03-11 ENCOUNTER — Ambulatory Visit
Admission: RE | Admit: 2021-03-11 | Discharge: 2021-03-11 | Disposition: A | Discharge status: Home / Self Care | Payer: 59 | Source: Ambulatory Visit | Attending: Radiation Oncology | Admitting: Radiation Oncology

## 2021-03-11 DIAGNOSIS — C61 Malignant neoplasm of prostate: Secondary | ICD-10-CM | POA: Diagnosis not present

## 2021-03-12 ENCOUNTER — Ambulatory Visit
Admission: RE | Admit: 2021-03-12 | Discharge: 2021-03-12 | Disposition: A | Discharge status: Home / Self Care | Payer: 59 | Source: Ambulatory Visit | Attending: Radiation Oncology | Admitting: Radiation Oncology

## 2021-03-12 DIAGNOSIS — C61 Malignant neoplasm of prostate: Secondary | ICD-10-CM | POA: Diagnosis not present

## 2021-03-13 ENCOUNTER — Other Ambulatory Visit: Payer: Self-pay

## 2021-03-13 ENCOUNTER — Ambulatory Visit
Admission: RE | Admit: 2021-03-13 | Discharge: 2021-03-13 | Disposition: A | Discharge status: Home / Self Care | Payer: 59 | Source: Ambulatory Visit | Attending: Radiation Oncology | Admitting: Radiation Oncology

## 2021-03-13 DIAGNOSIS — C61 Malignant neoplasm of prostate: Secondary | ICD-10-CM | POA: Diagnosis not present

## 2021-03-16 ENCOUNTER — Ambulatory Visit
Admission: RE | Admit: 2021-03-16 | Discharge: 2021-03-16 | Disposition: A | Discharge status: Home / Self Care | Payer: 59 | Source: Ambulatory Visit | Attending: Radiation Oncology | Admitting: Radiation Oncology

## 2021-03-16 ENCOUNTER — Other Ambulatory Visit: Payer: Self-pay

## 2021-03-16 DIAGNOSIS — C61 Malignant neoplasm of prostate: Secondary | ICD-10-CM | POA: Diagnosis not present

## 2021-03-17 ENCOUNTER — Ambulatory Visit
Admission: RE | Admit: 2021-03-17 | Discharge: 2021-03-17 | Disposition: A | Discharge status: Home / Self Care | Payer: 59 | Source: Ambulatory Visit | Attending: Radiation Oncology | Admitting: Radiation Oncology

## 2021-03-17 DIAGNOSIS — C61 Malignant neoplasm of prostate: Secondary | ICD-10-CM | POA: Diagnosis not present

## 2021-03-18 ENCOUNTER — Ambulatory Visit
Admission: RE | Admit: 2021-03-18 | Discharge: 2021-03-18 | Disposition: A | Discharge status: Home / Self Care | Payer: 59 | Source: Ambulatory Visit | Attending: Radiation Oncology | Admitting: Radiation Oncology

## 2021-03-18 ENCOUNTER — Other Ambulatory Visit: Payer: Self-pay

## 2021-03-18 DIAGNOSIS — C61 Malignant neoplasm of prostate: Secondary | ICD-10-CM | POA: Diagnosis not present

## 2021-03-19 ENCOUNTER — Ambulatory Visit
Admission: RE | Admit: 2021-03-19 | Discharge: 2021-03-19 | Disposition: A | Discharge status: Home / Self Care | Payer: 59 | Source: Ambulatory Visit | Attending: Radiation Oncology | Admitting: Radiation Oncology

## 2021-03-19 DIAGNOSIS — C61 Malignant neoplasm of prostate: Secondary | ICD-10-CM | POA: Diagnosis not present

## 2021-03-20 ENCOUNTER — Other Ambulatory Visit: Payer: Self-pay

## 2021-03-20 ENCOUNTER — Ambulatory Visit
Admission: RE | Admit: 2021-03-20 | Discharge: 2021-03-20 | Disposition: A | Discharge status: Home / Self Care | Payer: 59 | Source: Ambulatory Visit | Attending: Radiation Oncology | Admitting: Radiation Oncology

## 2021-03-20 DIAGNOSIS — C61 Malignant neoplasm of prostate: Secondary | ICD-10-CM | POA: Diagnosis not present

## 2021-03-23 ENCOUNTER — Other Ambulatory Visit: Payer: Self-pay | Admitting: Osteopathic Medicine

## 2021-03-23 ENCOUNTER — Other Ambulatory Visit: Payer: Self-pay

## 2021-03-23 ENCOUNTER — Ambulatory Visit
Admission: RE | Admit: 2021-03-23 | Discharge: 2021-03-23 | Disposition: A | Discharge status: Home / Self Care | Payer: 59 | Source: Ambulatory Visit | Attending: Radiation Oncology | Admitting: Radiation Oncology

## 2021-03-23 DIAGNOSIS — Z794 Long term (current) use of insulin: Secondary | ICD-10-CM

## 2021-03-23 DIAGNOSIS — E119 Type 2 diabetes mellitus without complications: Secondary | ICD-10-CM

## 2021-03-23 DIAGNOSIS — C61 Malignant neoplasm of prostate: Secondary | ICD-10-CM | POA: Diagnosis not present

## 2021-03-24 ENCOUNTER — Ambulatory Visit
Admission: RE | Admit: 2021-03-24 | Discharge: 2021-03-24 | Disposition: A | Discharge status: Home / Self Care | Payer: 59 | Source: Ambulatory Visit | Attending: Radiation Oncology | Admitting: Radiation Oncology

## 2021-03-24 DIAGNOSIS — C61 Malignant neoplasm of prostate: Secondary | ICD-10-CM | POA: Diagnosis not present

## 2021-03-25 ENCOUNTER — Ambulatory Visit
Admission: RE | Admit: 2021-03-25 | Discharge: 2021-03-25 | Disposition: A | Discharge status: Home / Self Care | Payer: 59 | Source: Ambulatory Visit | Attending: Radiation Oncology | Admitting: Radiation Oncology

## 2021-03-25 ENCOUNTER — Other Ambulatory Visit: Payer: Self-pay

## 2021-03-25 DIAGNOSIS — C61 Malignant neoplasm of prostate: Secondary | ICD-10-CM | POA: Diagnosis not present

## 2021-03-26 ENCOUNTER — Ambulatory Visit
Admission: RE | Admit: 2021-03-26 | Discharge: 2021-03-26 | Disposition: A | Discharge status: Home / Self Care | Payer: 59 | Source: Ambulatory Visit | Attending: Radiation Oncology | Admitting: Radiation Oncology

## 2021-03-26 DIAGNOSIS — C61 Malignant neoplasm of prostate: Secondary | ICD-10-CM | POA: Diagnosis not present

## 2021-03-27 ENCOUNTER — Other Ambulatory Visit: Payer: Self-pay

## 2021-03-27 ENCOUNTER — Ambulatory Visit
Admission: RE | Admit: 2021-03-27 | Discharge: 2021-03-27 | Disposition: A | Discharge status: Home / Self Care | Payer: 59 | Source: Ambulatory Visit | Attending: Radiation Oncology | Admitting: Radiation Oncology

## 2021-03-27 DIAGNOSIS — C61 Malignant neoplasm of prostate: Secondary | ICD-10-CM | POA: Diagnosis not present

## 2021-03-30 ENCOUNTER — Ambulatory Visit
Admission: RE | Admit: 2021-03-30 | Discharge: 2021-03-30 | Disposition: A | Discharge status: Home / Self Care | Payer: 59 | Source: Ambulatory Visit | Attending: Radiation Oncology | Admitting: Radiation Oncology

## 2021-03-30 DIAGNOSIS — C61 Malignant neoplasm of prostate: Secondary | ICD-10-CM | POA: Diagnosis not present

## 2021-03-31 ENCOUNTER — Ambulatory Visit
Admission: RE | Admit: 2021-03-31 | Discharge: 2021-03-31 | Disposition: A | Discharge status: Home / Self Care | Payer: 59 | Source: Ambulatory Visit | Attending: Radiation Oncology | Admitting: Radiation Oncology

## 2021-03-31 ENCOUNTER — Other Ambulatory Visit: Payer: Self-pay

## 2021-03-31 DIAGNOSIS — C61 Malignant neoplasm of prostate: Secondary | ICD-10-CM | POA: Diagnosis not present

## 2021-04-01 ENCOUNTER — Ambulatory Visit
Admission: RE | Admit: 2021-04-01 | Discharge: 2021-04-01 | Disposition: A | Discharge status: Home / Self Care | Payer: 59 | Source: Ambulatory Visit | Attending: Radiation Oncology | Admitting: Radiation Oncology

## 2021-04-01 DIAGNOSIS — C61 Malignant neoplasm of prostate: Secondary | ICD-10-CM | POA: Diagnosis not present

## 2021-04-02 ENCOUNTER — Ambulatory Visit
Admission: RE | Admit: 2021-04-02 | Discharge: 2021-04-02 | Disposition: A | Discharge status: Home / Self Care | Payer: 59 | Source: Ambulatory Visit | Attending: Radiation Oncology | Admitting: Radiation Oncology

## 2021-04-02 DIAGNOSIS — C61 Malignant neoplasm of prostate: Secondary | ICD-10-CM | POA: Diagnosis not present

## 2021-04-03 ENCOUNTER — Ambulatory Visit: Payer: 59

## 2021-04-06 ENCOUNTER — Ambulatory Visit
Admission: RE | Admit: 2021-04-06 | Discharge: 2021-04-06 | Disposition: A | Discharge status: Home / Self Care | Payer: 59 | Source: Ambulatory Visit | Attending: Radiation Oncology | Admitting: Radiation Oncology

## 2021-04-06 DIAGNOSIS — Z510 Encounter for antineoplastic radiation therapy: Secondary | ICD-10-CM | POA: Diagnosis not present

## 2021-04-06 DIAGNOSIS — C61 Malignant neoplasm of prostate: Secondary | ICD-10-CM | POA: Insufficient documentation

## 2021-04-07 ENCOUNTER — Ambulatory Visit
Admission: RE | Admit: 2021-04-07 | Discharge: 2021-04-07 | Disposition: A | Discharge status: Home / Self Care | Payer: 59 | Source: Ambulatory Visit | Attending: Radiation Oncology | Admitting: Radiation Oncology

## 2021-04-07 ENCOUNTER — Other Ambulatory Visit: Payer: Self-pay

## 2021-04-07 DIAGNOSIS — C61 Malignant neoplasm of prostate: Secondary | ICD-10-CM | POA: Diagnosis not present

## 2021-04-08 ENCOUNTER — Ambulatory Visit
Admission: RE | Admit: 2021-04-08 | Discharge: 2021-04-08 | Disposition: A | Discharge status: Home / Self Care | Payer: 59 | Source: Ambulatory Visit | Attending: Radiation Oncology | Admitting: Radiation Oncology

## 2021-04-08 ENCOUNTER — Encounter: Payer: Self-pay | Admitting: Urology

## 2021-04-08 DIAGNOSIS — C61 Malignant neoplasm of prostate: Secondary | ICD-10-CM | POA: Diagnosis not present

## 2021-05-03 ENCOUNTER — Other Ambulatory Visit: Payer: Self-pay | Admitting: Osteopathic Medicine

## 2021-05-13 ENCOUNTER — Ambulatory Visit
Admission: RE | Admit: 2021-05-13 | Discharge: 2021-05-13 | Disposition: A | Discharge status: Home / Self Care | Payer: 59 | Source: Ambulatory Visit | Attending: Urology | Admitting: Urology

## 2021-05-13 ENCOUNTER — Encounter: Payer: Self-pay | Admitting: Urology

## 2021-05-13 DIAGNOSIS — C61 Malignant neoplasm of prostate: Secondary | ICD-10-CM

## 2021-05-13 NOTE — Progress Notes (Signed)
Radiation Oncology         [phone] ________________________________  Name: Jesse Calhoun MRN: [id number]  Date: 05/13/2021  DOB: [birth year]  Post Treatment Note  CC: Emeterio Reeve, DO  Emeterio Reeve, DO  Diagnosis:   65 y.o. gentleman with stage T2a adenocarcinoma of the prostate with a Gleason's score of 4+4 and a PSA of 5.5  Interval Since Last Radiation:  5 weeks  02/10/21 - 04/08/21 (concurrent with LT-ADT- started Eligard 11/07/20): 1. The prostate, seminal vesicles, and pelvic lymph nodes were initially treated to 45 Gy in 25 fractions of 1.8 Gy  2. The prostate only was boosted to 75 Gy with 15 additional fractions of 2.0 Gy    Narrative: I spoke with the patient to conduct his routine scheduled 1 month follow up visit via telephone to spare the patient unnecessary potential exposure in the healthcare setting during the current COVID-19 pandemic.  The patient was notified in advance and gave permission to proceed with this visit format.  He tolerated radiation treatment relatively well with only minor urinary irritation and modest fatigue.  He reported nocturia 3-4 times per night, increased frequency, urgency, weak stream and occasional dysuria at the start of his stream.  He also reported diarrhea which was managed with Imodium as needed.                              On review of systems, the patient states that he is doing well in general.  His current IPSS score is 3 with nocturia x3 only.  He specifically denies dysuria, gross hematuria, straining to void, incomplete bladder emptying or incontinence.  He continues with occasional diarrhea but denies abdominal pain or constipation.  He feels that this is gradually improving as is his energy level.  He has been having some occasional  nausea and vomiting approximately once a week and usually in the mornings but he is not sure whether this is related to his recent radiation or possibly something else.  He reports a healthy  appetite and is maintaining his weight.  He has a scheduled visit with his PCP, Dr. Sheppard Coil, on 05/22/2021 for further evaluation.  He continues to tolerate the ADT fairly well with only occasional hot flashes and mild to moderate fatigue.  Overall, he is pleased with his progress to date.  ALLERGIES:  has No Known Allergies.  Meds: Current Outpatient Medications  Medication Sig Dispense Refill   ACCU-CHEK AVIVA PLUS test strip USE ONE STRIP TO TEST TWICE A DAY 200 strip 97   Accu-Chek Softclix Lancets lancets CHECK BLOOD SUGARS TWO TIMES A DAY 100 each 97   atorvastatin (LIPITOR) 80 MG tablet Take 1 tablet (80 mg total) by mouth daily. 90 tablet 3   B-D ULTRAFINE III SHORT PEN 31G X 8 MM MISC USE DAILY AS DIRECTED 100 each 97   Insulin Glargine (BASAGLAR KWIKPEN) 100 UNIT/ML INJECT 0.55 MLS (55 UNITS TOTAL) INTO THE SKIN DAILY. 30 mL 11   lisinopril (ZESTRIL) 40 MG tablet Take 1 tablet (40 mg total) by mouth daily. 90 tablet 3   ofloxacin (OCUFLOX) 0.3 % ophthalmic solution      Semaglutide,0.25 or 0.5MG /DOS, (OZEMPIC, 0.25 OR 0.5 MG/DOSE,) 2 MG/1.5ML SOPN DIAL AND INJECT UNDER THE SKIN 0.25 MG WEEKLY FOR 4 WEEKS THEN DIAL AND INJECT 0.5 MG WEEKLY THEREAFTER 1.5 mL 1   sildenafil (REVATIO) 20 MG tablet Take 1-5 tablets (20-100 mg total) by mouth  as needed (prior to sex). 50 tablet 11   terbinafine (LAMISIL) 1 % cream Apply 1 application topically 2 (two) times daily. Until skin issue resolves, then continue for 1 more week after that 36 g 1   No current facility-administered medications for this encounter.    Physical Findings:  vitals were not taken for this visit.  Pain Assessment Pain Score: 0-No pain/10 Unable to assess due to telephone follow-up visit format.  Lab Findings: Lab Results  Component Value Date   WBC 9.0 11/20/2020   HGB 15.0 11/20/2020   HCT 44.1 11/20/2020   MCV 89.6 11/20/2020   PLT 191 11/20/2020     Radiographic Findings: No results  found.  Impression/Plan: 1. 65 y.o. 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 He will continue to follow up with urology for ongoing PSA determinations and has an appointment scheduled with Dr. Junious Silk on 05/15/2021. He understands what to expect with regards to PSA monitoring going forward. I will look forward to following his response to treatment via correspondence with urology, and would be happy to continue to participate in his care if clinically indicated.  We discussed that the bowel irritation/inflammation that he is having with the associated diarrhea should continue to gradually improve and resolve over the next 4 to 6 weeks.  I talked to the patient about what to expect in the future, including his risk for erectile dysfunction and rectal bleeding. I encouraged him to call or return to the office if he has any questions regarding his previous radiation or possible radiation side effects. He was comfortable with this plan and will follow up as needed.     Nicholos Johns, PA-C

## 2021-05-13 NOTE — Progress Notes (Signed)
Patient reports nocturia x3, and diarrhea. No other symptoms reported at this time.  Meaningful use complete.  I-PSS Score of 3 (mild).  No current urinary management medications and urology follow-up scheduled for November 11th, 2022 -per [facility].  Patient notified of 8:30am-05/13/21 telephone appointment and verbalized understanding.

## 2021-05-13 NOTE — Addendum Note (Signed)
Encounter addended by: Freeman Caldron, PA-C on: 05/13/2021 9:10 AM  Actions taken: Order list changed, Diagnosis association updated

## 2021-05-13 NOTE — Progress Notes (Signed)
  Radiation Oncology         [phone] ________________________________  Name: Corky Blumstein MRN: [id number]  Date: 04/08/2021  DOB: 1955-09-14  End of Treatment Note  Diagnosis:   65 y.o. gentleman with stage T2a adenocarcinoma of the prostate with a Gleason's score of 4+4 and a PSA of 5.5     Indication for treatment:  Curative, Definitive Radiotherapy       Radiation treatment dates:   02/10/21 - 04/08/21 (concurrent with LT-ADT- started Eligard 11/07/20)  Site/dose:  1. The prostate, seminal vesicles, and pelvic lymph nodes were initially treated to 45 Gy in 25 fractions of 1.8 Gy  2. The prostate only was boosted to 75 Gy with 15 additional fractions of 2.0 Gy   Beams/energy:  1. The prostate, seminal vesicles, and pelvic lymph nodes were initially treated using VMAT intensity modulated radiotherapy delivering 6 megavolt photons. Image guidance was performed with CB-CT studies prior to each fraction. He was immobilized with a body fix lower extremity mold.  2. the prostate only was boosted using VMAT intensity modulated radiotherapy delivering 6 megavolt photons. Image guidance was performed with CB-CT studies prior to each fraction. He was immobilized with a body fix lower extremity mold.  Narrative: The patient tolerated radiation treatment relatively well with only minor urinary irritation and modest fatigue.  He reported nocturia 3-4 times per night, increased frequency, urgency, weak stream and occasional dysuria at the start of his stream.  He also reported diarrhea which was managed with Imodium as needed.  Plan: The patient has completed radiation treatment. He will return to radiation oncology clinic for routine followup in one month. I advised him to call or return sooner if he has any questions or concerns related to his recovery or treatment. ________________________________  Sheral Apley. Tammi Klippel, M.D.

## 2021-05-22 ENCOUNTER — Encounter: Payer: Self-pay | Admitting: Family Medicine

## 2021-05-22 ENCOUNTER — Other Ambulatory Visit: Payer: Self-pay

## 2021-05-22 ENCOUNTER — Ambulatory Visit (INDEPENDENT_AMBULATORY_CARE_PROVIDER_SITE_OTHER): Payer: 59 | Admitting: Family Medicine

## 2021-05-22 VITALS — BP 139/64 | HR 77 | Temp 97.8°F | Ht 68.0 in | Wt 233.0 lb

## 2021-05-22 DIAGNOSIS — E785 Hyperlipidemia, unspecified: Secondary | ICD-10-CM

## 2021-05-22 DIAGNOSIS — E1169 Type 2 diabetes mellitus with other specified complication: Secondary | ICD-10-CM

## 2021-05-22 DIAGNOSIS — E119 Type 2 diabetes mellitus without complications: Secondary | ICD-10-CM

## 2021-05-22 DIAGNOSIS — Z794 Long term (current) use of insulin: Secondary | ICD-10-CM | POA: Diagnosis not present

## 2021-05-22 DIAGNOSIS — I10 Essential (primary) hypertension: Secondary | ICD-10-CM

## 2021-05-22 DIAGNOSIS — C61 Malignant neoplasm of prostate: Secondary | ICD-10-CM | POA: Diagnosis not present

## 2021-05-22 LAB — POCT GLYCOSYLATED HEMOGLOBIN (HGB A1C): HbA1c, POC (controlled diabetic range): 6.6 % (ref 0.0–7.0)

## 2021-05-22 MED ORDER — BASAGLAR KWIKPEN 100 UNIT/ML ~~LOC~~ SOPN: 55.0000 [IU] | PEN_INJECTOR | Freq: Every day | SUBCUTANEOUS | 11 refills | Status: DC

## 2021-05-22 MED ORDER — BD PEN NEEDLE SHORT U/F 31G X 8 MM MISC: 97 refills | Status: DC

## 2021-05-24 ENCOUNTER — Encounter: Payer: Self-pay | Admitting: Family Medicine

## 2021-05-24 NOTE — Assessment & Plan Note (Signed)
Lab Results  Component Value Date   HGBA1C 6.6 05/22/2021  Blood sugar remains fairly well controlled.  He will continue Ozempic and Basaglar at current dosing.  Low carbohydrate diet encouraged.

## 2021-05-24 NOTE — Progress Notes (Signed)
Jesse Calhoun - 65 y.o. male MRN [id number]  Date of birth: June 17, 1956  Subjective No chief complaint on file.   HPI Jesse Calhoun is a 65 year old male here today for follow-up visit.  He is a previous patient of Dr. Sheppard Coil.  He has history of type 2 diabetes, hypertension, hyperlipidemia and prostate cancer.  He recently completed radiation treatment for prostate cancer.  He reports he is doing well at this time.  He states his blood sugars at home have been pretty well controlled with combination of Ozempic and Basaglar.  He has not had any symptoms of hypoglycemia with current medications.  He is taking atorvastatin as well for associated hyperlipidemia.  Blood pressures remain well controlled with lisinopril.  No issues with tolerance.  Denies chest pain, shortness of breath, palpitations, headache or vision changes.  ROS:  A comprehensive ROS was completed and negative except as noted per HPI  No Known Allergies  Past Medical History:  Diagnosis Date   Cataract    11/2015 and 12/2015   Diabetes mellitus without complication (HCC)    Hypertension    Prostate cancer ([place]) 05/23/2020    Past Surgical History:  Procedure Laterality Date   KNEE SURGERY     age 3   PROSTATE BIOPSY      Social History   Socioeconomic History   Marital status: Unknown    Spouse name: Not on file   Number of children: 0   Years of education: Not on file   Highest education level: Not on file  Occupational History   Not on file  Tobacco Use   Smoking status: Heavy Smoker    Packs/day: 5.00    Years: 0.00    Pack years: 0.00    Types: Cigars, Cigarettes   Smokeless tobacco: Never   Tobacco comments:    smokes approximately 5 cigars per day  Vaping Use   Vaping Use: Never used  Substance and Sexual Activity   Alcohol use: No    Alcohol/week: 0.0 standard drinks   Drug use: No   Sexual activity: Not Currently  Other Topics Concern   Not on file  Social History Narrative   Not on file    Social Determinants of Health   Financial Resource Strain: Not on file  Food Insecurity: Not on file  Transportation Needs: Not on file  Physical Activity: Not on file  Stress: Not on file  Social Connections: Not on file    Family History  Problem Relation Age of Onset   Diabetes Mother    Cancer Father        unknown type "possibly lung"   Breast cancer Neg Hx    Prostate cancer Neg Hx    Colon cancer Neg Hx    Pancreatic cancer Neg Hx     Health Maintenance  Topic Date Due   Zoster Vaccines- Shingrix (1 of 2) Never done   COVID-19 Vaccine (4 - Booster for [place] series) 05/30/2020   INFLUENZA VACCINE  02/02/2021   OPHTHALMOLOGY EXAM  02/02/2021   COLONOSCOPY (Pts 45-14yrs Insurance coverage will need to be confirmed)  08/22/2021 (Originally 07/27/2001)   Pneumococcal Vaccine 53-33 Years old (2 - PCV) 02/19/2022 (Originally 06/02/2016)   HEMOGLOBIN A1C  11/19/2021   FOOT EXAM  05/22/2022   TETANUS/TDAP  06/02/2025   Hepatitis C Screening  Completed   HIV Screening  Completed   HPV VACCINES  Aged Out     ----------------------------------------------------------------------------------------------------------------------------------------------------------------------------------------------------------------- Physical Exam BP 139/64 (BP Location: Left Arm, Patient  Position: Sitting, Cuff Size: Large)   Pulse 77   Temp 97.8 F (36.6 C)   Ht 5\' 8"  (1.727 m)   Wt 233 lb (105.7 kg)   SpO2 97%   BMI 35.43 kg/m   Physical Exam Constitutional:      Appearance: Normal appearance.  HENT:     Head: Normocephalic and atraumatic.  Eyes:     General: No scleral icterus. Cardiovascular:     Rate and Rhythm: Normal rate and regular rhythm.  Pulmonary:     Effort: Pulmonary effort is normal.     Breath sounds: Normal breath sounds.  Musculoskeletal:     Cervical back: Neck supple.  Neurological:     General: No focal deficit present.     Mental Status: He is  alert.  Psychiatric:        Mood and Affect: Mood normal.        Behavior: Behavior normal.    ------------------------------------------------------------------------------------------------------------------------------------------------------------------------------------------------------------------- Assessment and Plan  Essential hypertension Blood pressure remains fairly well controlled.  He will continue lisinopril at current strength.  Low-sodium diet encouraged.  Hyperlipidemia associated with type 2 diabetes mellitus ([place]) He is doing well with atorvastatin.  Continue current strength.  Type 2 diabetes mellitus without complication, with long-term current use of insulin (HCC) Lab Results  Component Value Date   HGBA1C 6.6 05/22/2021  Blood sugar remains fairly well controlled.  He will continue Ozempic and Basaglar at current dosing.  Low carbohydrate diet encouraged.  Prostate cancer [facility]) Recently completed radiation for prostate cancer.  He he will continue management through urology and oncology.   Meds ordered this encounter  Medications   Insulin Pen Needle (B-D ULTRAFINE III SHORT PEN) 31G X 8 MM MISC    Sig: Use to inject insulin daily    Dispense:  100 each    Refill:  97   Insulin Glargine (BASAGLAR KWIKPEN) 100 UNIT/ML    Sig: Inject 55 Units into the skin daily.    Dispense:  30 mL    Refill:  11    Return in about 6 months (around 11/19/2021) for HTN/T2DM.    This visit occurred during the SARS-CoV-2 public health emergency.  Safety protocols were in place, including screening questions prior to the visit, additional usage of staff PPE, and extensive cleaning of exam room while observing appropriate contact time as indicated for disinfecting solutions.

## 2021-05-24 NOTE — Assessment & Plan Note (Signed)
He is doing well with atorvastatin.  Continue current strength.

## 2021-05-24 NOTE — Assessment & Plan Note (Signed)
Recently completed radiation for prostate cancer.  He he will continue management through urology and oncology.

## 2021-05-24 NOTE — Assessment & Plan Note (Signed)
Blood pressure remains fairly well controlled.  He will continue lisinopril at current strength.  Low-sodium diet encouraged.

## 2021-06-01 ENCOUNTER — Telehealth: Payer: Self-pay | Admitting: *Deleted

## 2021-07-14 ENCOUNTER — Encounter: Payer: Self-pay | Admitting: *Deleted

## 2021-07-14 ENCOUNTER — Other Ambulatory Visit: Payer: Self-pay

## 2021-07-14 ENCOUNTER — Inpatient Hospital Stay: Payer: 59 | Attending: Adult Health | Admitting: *Deleted

## 2021-07-14 DIAGNOSIS — C61 Malignant neoplasm of prostate: Secondary | ICD-10-CM

## 2021-07-14 DIAGNOSIS — Z794 Long term (current) use of insulin: Secondary | ICD-10-CM

## 2021-07-14 MED ORDER — OZEMPIC (0.25 OR 0.5 MG/DOSE) 2 MG/1.5ML ~~LOC~~ SOPN: PEN_INJECTOR | SUBCUTANEOUS | 1 refills | Status: DC

## 2021-07-14 NOTE — Progress Notes (Signed)
2 Identifiers were used for this visit for verification purposes only. No vital signs were taken as this was a telephone visit.Pt denies pain but did say he has GI upset with loose stools 3 times a week since radiation tx. He takes Imodium to resolve it. Pt has been experiencing  random hot flashes with the Eligard. Pt has some mild burning at onset of urination. Pt says he does get up 3 times a night to urinate. He says he has no urgency but does feel like he has hesitancy and takes at least 2 voids to empty bladder completely. He says his stamina is decreased since treatment. Pt has never had a colonoscopy nor Cologuard. I did educate him on the importance of this but he was not interested. He says he lives in [place] and work in [place], has issues with time constraints. Pt did not have a flu vaccine this year. He was not interested in the Lung Cancer screening. He smokes 6-8 cigars a day. He will meet his new PCP in May at [facility].

## 2021-10-12 ENCOUNTER — Other Ambulatory Visit: Payer: Self-pay | Admitting: Family Medicine

## 2021-10-12 DIAGNOSIS — Z794 Long term (current) use of insulin: Secondary | ICD-10-CM

## 2021-10-12 DIAGNOSIS — E119 Type 2 diabetes mellitus without complications: Secondary | ICD-10-CM

## 2021-11-02 ENCOUNTER — Encounter: Payer: Self-pay | Admitting: Medical-Surgical

## 2021-11-02 ENCOUNTER — Ambulatory Visit: Payer: 59 | Admitting: Medical-Surgical

## 2021-11-02 VITALS — BP 113/75 | HR 76 | Resp 20 | Ht 68.0 in | Wt 231.2 lb

## 2021-11-02 DIAGNOSIS — T148XXA Other injury of unspecified body region, initial encounter: Secondary | ICD-10-CM

## 2021-11-02 DIAGNOSIS — Z794 Long term (current) use of insulin: Secondary | ICD-10-CM | POA: Diagnosis not present

## 2021-11-02 DIAGNOSIS — E119 Type 2 diabetes mellitus without complications: Secondary | ICD-10-CM | POA: Diagnosis not present

## 2021-11-02 NOTE — Progress Notes (Signed)
?  HPI with pertinent ROS:  ? ?CC: blisters on toes ? ?HPI: ?Pleasant 66 year old male presenting today for evaluation of blisters on his toes. Notes that on Thursday, he did a foot check and noted blisters surrounding the toenails on his right 3rd through 5th toes. Blisters were filled with bloody fluid. He is worried because he is diabetic and wanted to make sure there was nothing serious going on. Has not had any fever or chills. No unusual pain or tenderness to the toes. Usually wears steel toed shoes all the time but notes that he has worn a different pair of shoes a couple of times recently.  ? ?I reviewed the past medical history, family history, social history, surgical history, and allergies today and no changes were needed.  Please see the problem list section below in epic for further details. ? ? ?Physical exam:  ? ?General: Well Developed, well nourished, and in no acute distress.  ?Neuro: Alert and oriented x3.  ?HEENT: Normocephalic, atraumatic.  ?Skin: Warm and dry. See clinical photo. ?Cardiac: Regular rate and rhythm, no murmurs rubs or gallops, no lower extremity edema.  ?Respiratory: Clear to auscultation bilaterally. Not using accessory muscles, speaking in full sentences. ? ? ? ? ?Impression and Recommendations:   ? ?1. Blood blister ?2. Type 2 diabetes mellitus without complication, with long-term current use of insulin ([place]) ?Unclear etiology. Feel that this is more of a friction blister issues. Does not look infected at this point. Recommend monitoring closely for resolution or rupture of the blisters. Reviewed signs and symptoms of infection to monitor for. Referring to podiatry for further evaluation and diabetic foot care.  ?- Ambulatory referral to Podiatry ? ?Return for diabetes follow up with PCP as instructed. ?___________________________________________ ?Jesse Sorrel, DNP, APRN, FNP-BC ?Primary Care and Sports Medicine ?[place] ?

## 2021-11-19 ENCOUNTER — Ambulatory Visit: Payer: 59 | Admitting: Family Medicine

## 2021-11-19 ENCOUNTER — Encounter: Payer: Self-pay | Admitting: Family Medicine

## 2021-11-19 VITALS — BP 118/72 | HR 70 | Ht 68.0 in | Wt 227.0 lb

## 2021-11-19 DIAGNOSIS — E782 Mixed hyperlipidemia: Secondary | ICD-10-CM

## 2021-11-19 DIAGNOSIS — E119 Type 2 diabetes mellitus without complications: Secondary | ICD-10-CM

## 2021-11-19 DIAGNOSIS — E785 Hyperlipidemia, unspecified: Secondary | ICD-10-CM

## 2021-11-19 DIAGNOSIS — Z0000 Encounter for general adult medical examination without abnormal findings: Secondary | ICD-10-CM | POA: Diagnosis not present

## 2021-11-19 DIAGNOSIS — E1169 Type 2 diabetes mellitus with other specified complication: Secondary | ICD-10-CM

## 2021-11-19 DIAGNOSIS — T148XXA Other injury of unspecified body region, initial encounter: Secondary | ICD-10-CM | POA: Insufficient documentation

## 2021-11-19 DIAGNOSIS — I10 Essential (primary) hypertension: Secondary | ICD-10-CM

## 2021-11-19 DIAGNOSIS — Z794 Long term (current) use of insulin: Secondary | ICD-10-CM

## 2021-11-19 NOTE — Progress Notes (Signed)
Jesse Calhoun - 66 y.o. male MRN [id number]  Date of birth: [birth year]  Subjective Chief Complaint  Patient presents with   Hypertension   Diabetes   Blister    HPI Jesse Calhoun is a 66 y.o. male here today for follow up visit.  He would also like to have annual exam today.    Seen recently in our clinic by Joy for blood blister on toe.  Reports he had cut the grass in [place] without socks and thinks this come from friction.  This has improved some.  Denies pain or drainage.  He does have upcoming visit with podiatry.   He does check glucose occasionally.  Having some nausea from Ozempic but overall tolerating ok.    BP is well controlled with current dose of lisinopril.    He does continue to smoke.  No intention of quitting at this time.   He does not exercise regularly.  He feels that diet could be improved.    Review of Systems  Constitutional:  Negative for chills, fever, malaise/fatigue and weight loss.  HENT:  Negative for congestion, ear pain and sore throat.   Eyes:  Negative for blurred vision, double vision and pain.  Respiratory:  Negative for cough and shortness of breath.   Cardiovascular:  Negative for chest pain and palpitations.  Gastrointestinal:  Negative for abdominal pain, blood in stool, constipation, heartburn and nausea.  Genitourinary:  Negative for dysuria and urgency.  Musculoskeletal:  Negative for joint pain and myalgias.  Neurological:  Negative for dizziness and headaches.  Endo/Heme/Allergies:  Does not bruise/bleed easily.  Psychiatric/Behavioral:  Negative for depression. The patient is not nervous/anxious and does not have insomnia.    No Known Allergies  Past Medical History:  Diagnosis Date   Cataract    11/2015 and 12/2015   Diabetes mellitus without complication ([place])    Hypertension    Prostate cancer ([place]) 05/23/2020    Past Surgical History:  Procedure Laterality Date   KNEE SURGERY     age 57   PROSTATE BIOPSY      Social  History   Socioeconomic History   Marital status: Unknown    Spouse name: Not on file   Number of children: 0   Years of education: Not on file   Highest education level: Not on file  Occupational History   Not on file  Tobacco Use   Smoking status: Heavy Smoker    Packs/day: 5.00    Years: 0.00    Pack years: 0.00    Types: Cigars, Cigarettes   Smokeless tobacco: Never   Tobacco comments:    smokes approximately 6-8 cigars per day  Vaping Use   Vaping Use: Never used  Substance and Sexual Activity   Alcohol use: No    Alcohol/week: 0.0 standard drinks   Drug use: No   Sexual activity: Not Currently  Other Topics Concern   Not on file  Social History Narrative   Not on file   Social Determinants of Health   Financial Resource Strain: Low Risk    Difficulty of Paying Living Expenses: Not hard at all  Food Insecurity: No Food Insecurity   Worried About Charity fundraiser in the Last Year: Never true   Ran Out of Food in the Last Year: Never true  Transportation Needs: No Transportation Needs   Lack of Transportation (Medical): No   Lack of Transportation (Non-Medical): No  Physical Activity: Inactive   Days of Exercise per Week:  0 days   Minutes of Exercise per Session: 0 min  Stress: No Stress Concern Present   Feeling of Stress : Not at all  Social Connections: Unknown   Frequency of Communication with Friends and Family: Three times a week   Frequency of Social Gatherings with Friends and Family: Twice a week   Attends Religious Services: Never   Marine scientist or Organizations: No   Attends Music therapist: Never   Marital Status: Not on file    Family History  Problem Relation Age of Onset   Diabetes Mother    Cancer Father        unknown type "possibly lung"   Breast cancer Neg Hx    Prostate cancer Neg Hx    Colon cancer Neg Hx    Pancreatic cancer Neg Hx     Health Maintenance  Topic Date Due   HEMOGLOBIN A1C  11/19/2021    Zoster Vaccines- Shingrix (1 of 2) 12/24/2021 (Originally 07/28/1975)   OPHTHALMOLOGY EXAM  03/05/2022 (Originally 02/02/2021)   COVID-19 Vaccine (4 - Booster for [place] series) 03/05/2022 (Originally 05/30/2020)   Pneumonia Vaccine 82+ Years old (2 - PCV) 09/24/2022 (Originally 06/02/2016)   COLONOSCOPY (Pts 45-77yr Insurance coverage will need to be confirmed)  09/24/2022 (Originally 07/27/2001)   INFLUENZA VACCINE  02/02/2022   FOOT EXAM  05/22/2022   TETANUS/TDAP  06/02/2025   Hepatitis C Screening  Completed   HIV Screening  Completed   HPV VACCINES  Aged Out     ----------------------------------------------------------------------------------------------------------------------------------------------------------------------------------------------------------------- Physical Exam BP 118/72 (BP Location: Left Arm, Patient Position: Sitting, Cuff Size: Large)   Pulse 70   Ht '5\' 8"'$  (1.727 m)   Wt 227 lb (103 kg)   SpO2 99%   BMI 34.52 kg/m   Physical Exam Constitutional:      General: He is not in acute distress. HENT:     Head: Normocephalic and atraumatic.     Right Ear: Tympanic membrane and external ear normal.     Left Ear: Tympanic membrane and external ear normal.  Eyes:     General: No scleral icterus. Neck:     Thyroid: No thyromegaly.  Cardiovascular:     Rate and Rhythm: Normal rate and regular rhythm.     Heart sounds: Normal heart sounds.  Pulmonary:     Effort: Pulmonary effort is normal.     Breath sounds: Normal breath sounds.  Abdominal:     General: Bowel sounds are normal. There is no distension.     Palpations: Abdomen is soft.     Tenderness: There is no abdominal tenderness. There is no guarding.  Musculoskeletal:     Cervical back: Normal range of motion.  Lymphadenopathy:     Cervical: No cervical adenopathy.  Skin:    General: Skin is warm and dry.     Findings: No rash.  Neurological:     Mental Status: He is alert and oriented to  person, place, and time.     Cranial Nerves: No cranial nerve deficit.     Motor: No abnormal muscle tone.  Psychiatric:        Mood and Affect: Mood normal.        Behavior: Behavior normal.    ------------------------------------------------------------------------------------------------------------------------------------------------------------------------------------------------------------------- Assessment and Plan  Essential hypertension BP is well controlled at this time.  Continue lisinopril at current strength.   Type 2 diabetes mellitus without complication, with long-term [place] use of insulin [place] Updating a1c.  Continue current medications at  this time.  Encouraged healthy diet and to schedule eye exam.   Well adult exam Well adult Orders Placed This Encounter  Procedures   COMPLETE METABOLIC PANEL WITH GFR   CBC with Differential   Lipid Panel w/reflex Direct LDL   HgB A1c   Urine Microalbumin w/creat. ratio  Screenings: Per lab orders.  Colon cancer screening UTD.  Immunizations: UTD Anticipatory guidance/Risk factor reduction:  Recommendations per AVS.    Blistering of skin Stable, has upcoming visit with podiatry.    No orders of the defined types were placed in this encounter.   Return in about 6 months (around 05/22/2022) for HTN/T2DM.    This visit occurred during the SARS-CoV-2 public health emergency.  Safety protocols were in place, including screening questions prior to the visit, additional usage of staff PPE, and extensive cleaning of exam room while observing appropriate contact time as indicated for disinfecting solutions.

## 2021-11-19 NOTE — Patient Instructions (Signed)
Preventive Care 65 Years and Older, Male Preventive care refers to lifestyle choices and visits with your health care provider that can promote health and wellness. Preventive care visits are also called wellness exams. What can I expect for my preventive care visit? Counseling During your preventive care visit, your health care provider may ask about your: Medical history, including: Past medical problems. Family medical history. History of falls. Current health, including: Emotional well-being. Home life and relationship well-being. Sexual activity. Memory and ability to understand (cognition). Lifestyle, including: Alcohol, nicotine or tobacco, and drug use. Access to firearms. Diet, exercise, and sleep habits. Work and work environment. Sunscreen use. Safety issues such as seatbelt and bike helmet use. Physical exam Your health care provider will check your: Height and weight. These may be used to calculate your BMI (body mass index). BMI is a measurement that tells if you are at a healthy weight. Waist circumference. This measures the distance around your waistline. This measurement also tells if you are at a healthy weight and may help predict your risk of certain diseases, such as type 2 diabetes and high blood pressure. Heart rate and blood pressure. Body temperature. Skin for abnormal spots. What immunizations do I need?  Vaccines are usually given at various ages, according to a schedule. Your health care provider will recommend vaccines for you based on your age, medical history, and lifestyle or other factors, such as travel or where you work. What tests do I need? Screening Your health care provider may recommend screening tests for certain conditions. This may include: Lipid and cholesterol levels. Diabetes screening. This is done by checking your blood sugar (glucose) after you have not eaten for a while (fasting). Hepatitis C test. Hepatitis B test. HIV (human  immunodeficiency virus) test. STI (sexually transmitted infection) testing, if you are at risk. Lung cancer screening. Colorectal cancer screening. Prostate cancer screening. Abdominal aortic aneurysm (AAA) screening. You may need this if you are a current or former smoker. Talk with your health care provider about your test results, treatment options, and if necessary, the need for more tests. Follow these instructions at home: Eating and drinking  Eat a diet that includes fresh fruits and vegetables, whole grains, lean protein, and low-fat dairy products. Limit your intake of foods with high amounts of sugar, saturated fats, and salt. Take vitamin and mineral supplements as recommended by your health care provider. Do not drink alcohol if your health care provider tells you not to drink. If you drink alcohol: Limit how much you have to 0-2 drinks a day. Know how much alcohol is in your drink. In the [place] one drink equals one 12 oz bottle of beer (355 mL), one 5 oz glass of wine (148 mL), or one 1 oz glass of hard liquor (44 mL). Lifestyle Brush your teeth every morning and night with fluoride toothpaste. Floss one time each day. Exercise for at least 30 minutes 5 or more days each week. Do not use any products that contain nicotine or tobacco. These products include cigarettes, chewing tobacco, and vaping devices, such as e-cigarettes. If you need help quitting, ask your health care provider. Do not use drugs. If you are sexually active, practice safe sex. Use a condom or other form of protection to prevent STIs. Take aspirin only as told by your health care provider. Make sure that you understand how much to take and what form to take. Work with your health care provider to find out whether it is safe   and beneficial for you to take aspirin daily. Ask your health care provider if you need to take a cholesterol-lowering medicine (statin). Find healthy ways to manage stress, such  as: Meditation, yoga, or listening to music. Journaling. Talking to a trusted person. Spending time with friends and family. Safety Always wear your seat belt while driving or riding in a vehicle. Do not drive: If you have been drinking alcohol. Do not ride with someone who has been drinking. When you are tired or distracted. While texting. If you have been using any mind-altering substances or drugs. Wear a helmet and other protective equipment during sports activities. If you have firearms in your house, make sure you follow all gun safety procedures. Minimize exposure to UV radiation to reduce your risk of skin cancer. What's next? Visit your health care provider once a year for an annual wellness visit. Ask your health care provider how often you should have your eyes and teeth checked. Stay up to date on all vaccines. This information is not intended to replace advice given to you by your health care provider. Make sure you discuss any questions you have with your health care provider. Document Revised: 12/17/2020 Document Reviewed: 12/17/2020 Elsevier Patient Education  2023 Elsevier Inc.  

## 2021-11-19 NOTE — Assessment & Plan Note (Signed)
Stable, has upcoming visit with podiatry.

## 2021-11-19 NOTE — Assessment & Plan Note (Signed)
Updating a1c.  Continue current medications at this time.  Encouraged healthy diet and to schedule eye exam.

## 2021-11-19 NOTE — Assessment & Plan Note (Signed)
Well adult Orders Placed This Encounter  Procedures  . COMPLETE METABOLIC PANEL WITH GFR  . CBC with Differential  . Lipid Panel w/reflex Direct LDL  . HgB A1c  . Urine Microalbumin w/creat. ratio  Screenings: Per lab orders.  Colon cancer screening UTD.  Immunizations: UTD Anticipatory guidance/Risk factor reduction:  Recommendations per AVS.

## 2021-11-19 NOTE — Assessment & Plan Note (Signed)
BP is well controlled at this time.  Continue lisinopril at current strength.

## 2021-11-20 LAB — COMPLETE METABOLIC PANEL WITH GFR
AG Ratio: 1.3 (calc) (ref 1.0–2.5)
ALT: 22 U/L (ref 9–46)
AST: 15 U/L (ref 10–35)
Albumin: 4.1 g/dL (ref 3.6–5.1)
Alkaline phosphatase (APISO): 64 U/L (ref 35–144)
BUN/Creatinine Ratio: 28 (calc) — ABNORMAL HIGH (ref 6–22)
BUN: 53 mg/dL — ABNORMAL HIGH (ref 7–25)
CO2: 18 mmol/L — ABNORMAL LOW (ref 20–32)
Calcium: 9.4 mg/dL (ref 8.6–10.3)
Chloride: 111 mmol/L — ABNORMAL HIGH (ref 98–110)
Creat: 1.9 mg/dL — ABNORMAL HIGH (ref 0.70–1.35)
Globulin: 3.1 g/dL (calc) (ref 1.9–3.7)
Glucose, Bld: 141 mg/dL — ABNORMAL HIGH (ref 65–99)
Potassium: 5.7 mmol/L — ABNORMAL HIGH (ref 3.5–5.3)
Sodium: 139 mmol/L (ref 135–146)
Total Bilirubin: 0.2 mg/dL (ref 0.2–1.2)
Total Protein: 7.2 g/dL (ref 6.1–8.1)
eGFR: 39 mL/min/{1.73_m2} — ABNORMAL LOW (ref >=60)

## 2021-11-20 LAB — CBC WITH DIFFERENTIAL/PLATELET
Absolute Monocytes: 653 cells/uL (ref 200–950)
Basophils Absolute: 68 cells/uL (ref 0–200)
Basophils Relative: 0.9 %
Eosinophils Absolute: 375 cells/uL (ref 15–500)
Eosinophils Relative: 5 %
HCT: 37.5 % — ABNORMAL LOW (ref 38.5–50.0)
Hemoglobin: 12.8 g/dL — ABNORMAL LOW (ref 13.2–17.1)
Lymphs Abs: 615 cells/uL — ABNORMAL LOW (ref 850–3900)
MCH: 31.8 pg (ref 27.0–33.0)
MCHC: 34.1 g/dL (ref 32.0–36.0)
MCV: 93.1 fL (ref 80.0–100.0)
MPV: 11.9 fL (ref 7.5–12.5)
Monocytes Relative: 8.7 %
Neutro Abs: 5790 cells/uL (ref 1500–7800)
Neutrophils Relative %: 77.2 %
Platelets: 203 10*3/uL (ref 140–400)
RBC: 4.03 10*6/uL — ABNORMAL LOW (ref 4.20–5.80)
RDW: 13.5 % (ref 11.0–15.0)
Total Lymphocyte: 8.2 %
WBC: 7.5 10*3/uL (ref 3.8–10.8)

## 2021-11-20 LAB — MICROALBUMIN / CREATININE URINE RATIO
Creatinine, Urine: 115 mg/dL (ref 20–320)
Microalb Creat Ratio: 10 mcg/mg creat (ref <30)
Microalb, Ur: 1.2 mg/dL

## 2021-11-20 LAB — LIPID PANEL W/REFLEX DIRECT LDL
Cholesterol: 135 mg/dL (ref <200)
HDL: 31 mg/dL — ABNORMAL LOW (ref >=40)
LDL Cholesterol (Calc): 77 mg/dL (calc)
Non-HDL Cholesterol (Calc): 104 mg/dL (calc) (ref <130)
Total CHOL/HDL Ratio: 4.4 (calc) (ref <5.0)
Triglycerides: 174 mg/dL — ABNORMAL HIGH (ref <150)

## 2021-11-20 LAB — HEMOGLOBIN A1C
Hgb A1c MFr Bld: 7.6 % of total Hgb — ABNORMAL HIGH (ref <5.7)
Mean Plasma Glucose: 171 mg/dL
eAG (mmol/L): 9.5 mmol/L

## 2021-11-26 ENCOUNTER — Encounter: Payer: Self-pay | Admitting: Podiatry

## 2021-11-26 ENCOUNTER — Ambulatory Visit (INDEPENDENT_AMBULATORY_CARE_PROVIDER_SITE_OTHER): Payer: 59 | Admitting: Podiatry

## 2021-11-26 DIAGNOSIS — Z794 Long term (current) use of insulin: Secondary | ICD-10-CM

## 2021-11-26 DIAGNOSIS — E119 Type 2 diabetes mellitus without complications: Secondary | ICD-10-CM

## 2021-11-26 DIAGNOSIS — S90424A Blister (nonthermal), right lesser toe(s), initial encounter: Secondary | ICD-10-CM

## 2021-11-26 NOTE — Progress Notes (Signed)
  Subjective:  Patient ID: Jesse Calhoun, male    DOB: 18-May-1956,   MRN: [id number]  Chief Complaint  Patient presents with   Blister     R ft blister on 3 toes    66 y.o. male presents for concern of blisters on his feet. Relates they popped up about two weeks ago and thinks it was relates to wearing a pair of shoes without tying them tight. States he has not done anything for them but they are dark and was concerned.  Patient is a diabetic and last A1c was 7.6 . Denies any other pedal complaints. Denies n/v/f/c.   Past Medical History:  Diagnosis Date   Cataract    11/2015 and 12/2015   Diabetes mellitus without complication ([place])    Hypertension    Prostate cancer ([place]) 05/23/2020    Objective:  Physical Exam: Vascular: DP/PT pulses 2/4 bilateral. CFT <3 seconds. Normal hair growth on digits. No edema.  Skin. No lacerations or abrasions bilateral feet. Dried hemosiderin blisters noted to distal third fourth and fifth digits. Upon debridement. Skin underlying well healed and no areas open or of concern.  Musculoskeletal: MMT 5/5 bilateral lower extremities in DF, PF, Inversion and Eversion. Deceased ROM in DF of ankle joint.  Neurological: Sensation intact to light touch.   Assessment:   1. Type 2 diabetes mellitus without complication, with long-term current use of insulin (HCC)   2. Blister of toe of right foot, initial encounter      Plan:  Patient was evaluated and treated and all questions answered. -Discussed and educated patient on diabetic foot care, especially with  regards to the vascular, neurological and musculoskeletal systems.  -Stressed the importance of good glycemic control and the detriment of not  controlling glucose levels in relation to the foot. -Discussed supportive shoes at all times and checking feet regularly.  -Dried blisters were removed from right third fourth and fifth toes and skin well healed underneath.   -Answered all patient  questions -Patient to return  in one year for diabetic foot check.  -Patient advised to call the office if any problems or questions arise in the meantime.   Lorenda Peck, DPM

## 2021-11-27 ENCOUNTER — Other Ambulatory Visit: Payer: Self-pay | Admitting: Family Medicine

## 2021-11-27 DIAGNOSIS — E875 Hyperkalemia: Secondary | ICD-10-CM

## 2021-11-27 DIAGNOSIS — R7989 Other specified abnormal findings of blood chemistry: Secondary | ICD-10-CM

## 2021-12-01 ENCOUNTER — Other Ambulatory Visit: Payer: Self-pay

## 2021-12-01 DIAGNOSIS — N289 Disorder of kidney and ureter, unspecified: Secondary | ICD-10-CM

## 2021-12-05 ENCOUNTER — Other Ambulatory Visit: Payer: Self-pay | Admitting: Family Medicine

## 2021-12-09 LAB — RENAL FUNCTION PANEL
Albumin: 4 g/dL (ref 3.6–5.1)
BUN/Creatinine Ratio: 15 (calc) (ref 6–22)
BUN: 24 mg/dL (ref 7–25)
CO2: 23 mmol/L (ref 20–32)
Calcium: 9.9 mg/dL (ref 8.6–10.3)
Chloride: 110 mmol/L (ref 98–110)
Creat: 1.6 mg/dL — ABNORMAL HIGH (ref 0.70–1.35)
Glucose, Bld: 96 mg/dL (ref 65–99)
Phosphorus: 4.7 mg/dL — ABNORMAL HIGH (ref 2.1–4.3)
Potassium: 5.3 mmol/L (ref 3.5–5.3)
Sodium: 140 mmol/L (ref 135–146)

## 2021-12-09 LAB — IRON,TIBC AND FERRITIN PANEL
%SAT: 20 % (calc) (ref 20–48)
Ferritin: 141 ng/mL (ref 24–380)
Iron: 67 ug/dL (ref 50–180)
TIBC: 329 mcg/dL (calc) (ref 250–425)

## 2021-12-15 ENCOUNTER — Other Ambulatory Visit: Payer: Self-pay | Admitting: Osteopathic Medicine

## 2021-12-27 ENCOUNTER — Other Ambulatory Visit: Payer: Self-pay | Admitting: Osteopathic Medicine

## 2022-01-06 DIAGNOSIS — M109 Gout, unspecified: Secondary | ICD-10-CM | POA: Diagnosis not present

## 2022-01-06 DIAGNOSIS — S0993XA Unspecified injury of face, initial encounter: Secondary | ICD-10-CM | POA: Diagnosis not present

## 2022-01-06 DIAGNOSIS — N320 Bladder-neck obstruction: Secondary | ICD-10-CM | POA: Diagnosis not present

## 2022-01-06 DIAGNOSIS — N179 Acute kidney failure, unspecified: Secondary | ICD-10-CM | POA: Diagnosis not present

## 2022-01-06 DIAGNOSIS — S22089D Unspecified fracture of T11-T12 vertebra, subsequent encounter for fracture with routine healing: Secondary | ICD-10-CM | POA: Diagnosis not present

## 2022-01-06 DIAGNOSIS — K5792 Diverticulitis of intestine, part unspecified, without perforation or abscess without bleeding: Secondary | ICD-10-CM | POA: Insufficient documentation

## 2022-01-06 DIAGNOSIS — I493 Ventricular premature depolarization: Secondary | ICD-10-CM | POA: Diagnosis not present

## 2022-01-06 DIAGNOSIS — S22080A Wedge compression fracture of T11-T12 vertebra, initial encounter for closed fracture: Secondary | ICD-10-CM | POA: Diagnosis not present

## 2022-01-06 DIAGNOSIS — J986 Disorders of diaphragm: Secondary | ICD-10-CM | POA: Diagnosis not present

## 2022-01-06 DIAGNOSIS — S22089A Unspecified fracture of T11-T12 vertebra, initial encounter for closed fracture: Secondary | ICD-10-CM | POA: Diagnosis not present

## 2022-01-06 DIAGNOSIS — S32019A Unspecified fracture of first lumbar vertebra, initial encounter for closed fracture: Secondary | ICD-10-CM | POA: Diagnosis not present

## 2022-01-06 DIAGNOSIS — S32010A Wedge compression fracture of first lumbar vertebra, initial encounter for closed fracture: Secondary | ICD-10-CM | POA: Insufficient documentation

## 2022-01-06 DIAGNOSIS — S32019D Unspecified fracture of first lumbar vertebra, subsequent encounter for fracture with routine healing: Secondary | ICD-10-CM | POA: Diagnosis not present

## 2022-01-06 DIAGNOSIS — E872 Acidosis, unspecified: Secondary | ICD-10-CM | POA: Diagnosis not present

## 2022-01-06 DIAGNOSIS — Z20822 Contact with and (suspected) exposure to covid-19: Secondary | ICD-10-CM | POA: Diagnosis not present

## 2022-01-06 DIAGNOSIS — E861 Hypovolemia: Secondary | ICD-10-CM | POA: Diagnosis not present

## 2022-01-06 DIAGNOSIS — R55 Syncope and collapse: Secondary | ICD-10-CM | POA: Diagnosis not present

## 2022-01-06 DIAGNOSIS — I10 Essential (primary) hypertension: Secondary | ICD-10-CM | POA: Diagnosis not present

## 2022-01-06 DIAGNOSIS — M542 Cervicalgia: Secondary | ICD-10-CM | POA: Diagnosis not present

## 2022-01-06 DIAGNOSIS — C61 Malignant neoplasm of prostate: Secondary | ICD-10-CM | POA: Diagnosis not present

## 2022-01-06 DIAGNOSIS — M545 Low back pain, unspecified: Secondary | ICD-10-CM | POA: Diagnosis not present

## 2022-01-06 DIAGNOSIS — M25562 Pain in left knee: Secondary | ICD-10-CM | POA: Diagnosis not present

## 2022-01-06 DIAGNOSIS — F17200 Nicotine dependence, unspecified, uncomplicated: Secondary | ICD-10-CM | POA: Diagnosis not present

## 2022-01-06 DIAGNOSIS — M25462 Effusion, left knee: Secondary | ICD-10-CM | POA: Diagnosis not present

## 2022-01-06 DIAGNOSIS — E1169 Type 2 diabetes mellitus with other specified complication: Secondary | ICD-10-CM | POA: Diagnosis not present

## 2022-01-06 DIAGNOSIS — S0990XA Unspecified injury of head, initial encounter: Secondary | ICD-10-CM | POA: Diagnosis not present

## 2022-01-06 DIAGNOSIS — I9589 Other hypotension: Secondary | ICD-10-CM | POA: Diagnosis not present

## 2022-01-06 DIAGNOSIS — E785 Hyperlipidemia, unspecified: Secondary | ICD-10-CM | POA: Diagnosis not present

## 2022-01-06 DIAGNOSIS — S199XXA Unspecified injury of neck, initial encounter: Secondary | ICD-10-CM | POA: Diagnosis not present

## 2022-01-06 DIAGNOSIS — I959 Hypotension, unspecified: Secondary | ICD-10-CM | POA: Diagnosis not present

## 2022-01-06 DIAGNOSIS — R0602 Shortness of breath: Secondary | ICD-10-CM | POA: Diagnosis not present

## 2022-01-12 DIAGNOSIS — N320 Bladder-neck obstruction: Secondary | ICD-10-CM | POA: Insufficient documentation

## 2022-01-18 ENCOUNTER — Telehealth: Payer: Self-pay | Admitting: General Practice

## 2022-01-18 NOTE — Telephone Encounter (Signed)
Transition Care Management Follow-up Telephone Call Date of discharge and from where: 01/15/22 from [facility] [place] How have you been since you were released from the hospital? Patient is still experiencing nausea but its not as bad as it was. He is not cleared to drive yet. He has stopped the Ozempic and his lisinopril.  Any questions or concerns? No  Items Reviewed: Did the pt receive and understand the discharge instructions provided? Yes  Medications obtained and verified? Yes  Other? No  Any new allergies since your discharge? No  Dietary orders reviewed? Yes Do you have support at home? Yes   Home Care and Equipment/Supplies: Were home health services ordered? no  Functional Questionnaire: (I = Independent and D = Dependent) ADLs: I  Bathing/Dressing- I  Meal Prep- I  Eating- I  Maintaining continence- I  Transferring/Ambulation- I  Managing Meds- I  Follow up appointments reviewed:  PCP Hospital f/u appt confirmed? Yes  Scheduled to see PCP on  01/28/22 @ 1510. [facility] f/u appt confirmed? Yes  Scheduled to see Dr. Rosanna Randy on 01/22/22.  Are transportation arrangements needed? No  If their condition worsens, is the pt aware to call PCP or go to the Emergency Dept.? Yes Was the patient provided with contact information for the PCP's office or ED? Yes Was to pt encouraged to call back with questions or concerns? Yes

## 2022-01-19 DIAGNOSIS — N179 Acute kidney failure, unspecified: Secondary | ICD-10-CM | POA: Diagnosis not present

## 2022-01-19 DIAGNOSIS — E119 Type 2 diabetes mellitus without complications: Secondary | ICD-10-CM | POA: Diagnosis not present

## 2022-01-19 DIAGNOSIS — C61 Malignant neoplasm of prostate: Secondary | ICD-10-CM | POA: Diagnosis not present

## 2022-01-19 DIAGNOSIS — I10 Essential (primary) hypertension: Secondary | ICD-10-CM | POA: Diagnosis not present

## 2022-01-19 DIAGNOSIS — E785 Hyperlipidemia, unspecified: Secondary | ICD-10-CM | POA: Diagnosis not present

## 2022-01-19 DIAGNOSIS — K5792 Diverticulitis of intestine, part unspecified, without perforation or abscess without bleeding: Secondary | ICD-10-CM | POA: Diagnosis not present

## 2022-01-19 DIAGNOSIS — S32010D Wedge compression fracture of first lumbar vertebra, subsequent encounter for fracture with routine healing: Secondary | ICD-10-CM | POA: Diagnosis not present

## 2022-01-22 DIAGNOSIS — I10 Essential (primary) hypertension: Secondary | ICD-10-CM | POA: Diagnosis not present

## 2022-01-22 DIAGNOSIS — K5792 Diverticulitis of intestine, part unspecified, without perforation or abscess without bleeding: Secondary | ICD-10-CM | POA: Diagnosis not present

## 2022-01-22 DIAGNOSIS — C61 Malignant neoplasm of prostate: Secondary | ICD-10-CM | POA: Diagnosis not present

## 2022-01-22 DIAGNOSIS — E1165 Type 2 diabetes mellitus with hyperglycemia: Secondary | ICD-10-CM | POA: Diagnosis not present

## 2022-01-25 ENCOUNTER — Other Ambulatory Visit: Payer: Self-pay | Admitting: Osteopathic Medicine

## 2022-01-28 ENCOUNTER — Ambulatory Visit (INDEPENDENT_AMBULATORY_CARE_PROVIDER_SITE_OTHER): Payer: BC Managed Care – PPO | Admitting: Family Medicine

## 2022-01-28 ENCOUNTER — Encounter: Payer: Self-pay | Admitting: Family Medicine

## 2022-01-28 VITALS — BP 135/69 | HR 84 | Ht 68.0 in | Wt 210.0 lb

## 2022-01-28 DIAGNOSIS — K5792 Diverticulitis of intestine, part unspecified, without perforation or abscess without bleeding: Secondary | ICD-10-CM | POA: Diagnosis not present

## 2022-01-28 DIAGNOSIS — S32010A Wedge compression fracture of first lumbar vertebra, initial encounter for closed fracture: Secondary | ICD-10-CM

## 2022-01-28 DIAGNOSIS — N179 Acute kidney failure, unspecified: Secondary | ICD-10-CM | POA: Diagnosis not present

## 2022-01-28 NOTE — Progress Notes (Signed)
Diarrhea, acute diverticulitis, AKI 6-1.3, syncope, Completed CTX/Flagyl  L1 compressoin TLSO brace   Jesse Calhoun - 66 y.o. male MRN [id number]  Date of birth: 05/27/1956  Subjective No chief complaint on file.   HPI Jesse Calhoun is a 66 year old male here today for hospital follow-up.  Patient was admitted to hospital after having syncopal episode.  EMS found patient hypotensive at 70/48.  He had had 1 week history of profuse watery diarrhea and decreased p.o. intake.  Blood pressure improved with IV fluids.  Labs notable for acute kidney injury with creatinine of 6.3 and BUN of 87.  CT scan notable for T T11 and L1 compression fractures.  Also evidence of diverticulitis.  Started on empiric antibiotics for treatment of diverticulitis.  Neurosurgery was consulted during his hospitalization and recommended nonsurgical management.  He reports that it was recommended that he remain out of work for 8 weeks to allow for healing of fracture.  He does report that he does some lifting at work.  He does work as an Chief Financial Officer.  He does not have any significant pain at this time.  He is supposed to follow-up with neurosurgery but does not have an appointment scheduled at this time.  In regards to diverticulitis he denies any abdominal pain.  Appetite has improved.  He does have some fatigue.  Urine output is normal.  His creatinine had improved to 1.3 prior to discharge.  ROS:  A comprehensive ROS was completed and negative except as noted per HPI  No Known Allergies  Past Medical History:  Diagnosis Date   Cataract    11/2015 and 12/2015   Diabetes mellitus without complication (HCC)    Hypertension    Prostate cancer ([place]) 05/23/2020    Past Surgical History:  Procedure Laterality Date   KNEE SURGERY     age 50   PROSTATE BIOPSY      Social History   Socioeconomic History   Marital status: Unknown    Spouse name: Not on file   Number of children: 0   Years of education: Not on file   Highest  education level: Not on file  Occupational History   Not on file  Tobacco Use   Smoking status: Heavy Smoker    Packs/day: 5.00    Years: 0.00    Total pack years: 0.00    Types: Cigars, Cigarettes   Smokeless tobacco: Never   Tobacco comments:    smokes approximately 6-8 cigars per day  Vaping Use   Vaping Use: Never used  Substance and Sexual Activity   Alcohol use: No    Alcohol/week: 0.0 standard drinks of alcohol   Drug use: No   Sexual activity: Not Currently  Other Topics Concern   Not on file  Social History Narrative   Not on file   Social Determinants of Health   Financial Resource Strain: Low Risk  (07/14/2021)   Overall Financial Resource Strain (CARDIA)    Difficulty of Paying Living Expenses: Not hard at all  Food Insecurity: No Food Insecurity (07/14/2021)   Hunger Vital Sign    Worried About Running Out of Food in the Last Year: Never true    Ran Out of Food in the Last Year: Never true  Transportation Needs: No Transportation Needs (07/14/2021)   PRAPARE - Hydrologist (Medical): No    Lack of Transportation (Non-Medical): No  Physical Activity: Inactive (07/14/2021)   Exercise Vital Sign    Days of Exercise per  Week: 0 days    Minutes of Exercise per Session: 0 min  Stress: No Stress Concern Present (07/14/2021)   [place]    Feeling of Stress : Not at all  Social Connections: Unknown (07/14/2021)   Social Connection and Isolation Panel [NHANES]    Frequency of Communication with Friends and Family: Three times a week    Frequency of Social Gatherings with Friends and Family: Twice a week    Attends Religious Services: Never    Marine scientist or Organizations: No    Attends Music therapist: Never    Marital Status: Not on file    Family History  Problem Relation Age of Onset   Diabetes Mother    Cancer Father        unknown type  "possibly lung"   Breast cancer Neg Hx    Prostate cancer Neg Hx    Colon cancer Neg Hx    Pancreatic cancer Neg Hx     Health Maintenance  Topic Date Due   OPHTHALMOLOGY EXAM  03/05/2022 (Originally 02/02/2021)   COVID-19 Vaccine (4 - Booster for [place] series) 03/05/2022 (Originally 05/30/2020)   Zoster Vaccines- Shingrix (1 of 2) 04/30/2022 (Originally 07/28/1975)   Pneumonia Vaccine 39+ Years old (2 - PCV) 09/24/2022 (Originally 06/02/2016)   COLONOSCOPY (Pts 45-2yr Insurance coverage will need to be confirmed)  09/24/2022 (Originally 07/27/2001)   INFLUENZA VACCINE  02/02/2022   FOOT EXAM  05/22/2022   HEMOGLOBIN A1C  05/22/2022   Diabetic kidney evaluation - Urine ACR  11/20/2022   Diabetic kidney evaluation - GFR measurement  12/09/2022   TETANUS/TDAP  06/02/2025   Hepatitis C Screening  Completed   HIV Screening  Completed   HPV VACCINES  Aged Out     ----------------------------------------------------------------------------------------------------------------------------------------------------------------------------------------------------------------- Physical Exam BP 135/69 (BP Location: Left Arm, Patient Position: Sitting, Cuff Size: Large)   Pulse 84   Ht '5\' 8"'$  (1.727 m)   Wt 210 lb (95.3 kg)   SpO2 95%   BMI 31.93 kg/m   Physical Exam Constitutional:      Appearance: Normal appearance.  Eyes:     General: No scleral icterus. Cardiovascular:     Rate and Rhythm: Normal rate and regular rhythm.  Pulmonary:     Effort: Pulmonary effort is normal.     Breath sounds: Normal breath sounds.  Abdominal:     General: Abdomen is flat. There is no distension.     Palpations: Abdomen is soft.     Tenderness: There is no abdominal tenderness.  Musculoskeletal:     Cervical back: Neck supple.  Neurological:     Mental Status: He is alert.  Psychiatric:        Mood and Affect: Mood normal.        Behavior: Behavior normal.      ------------------------------------------------------------------------------------------------------------------------------------------------------------------------------------------------------------------- Assessment and Plan  Compression fracture of L1 lumbar vertebra, closed, initial encounter ([facility] He is supposed to follow-up with neurosurgery however has not had appointment scheduled.  He will call to schedule appointment if he has not heard anything by next week.  In regards to work I recommend that he remain out of work for now.  If there is prolonged wait to see neurosurgery I think he can probably return to work with restrictions for the time being.  Diverticulitis Completed course of antibiotics.  Stable at this time.  AKI (acute kidney injury) ([place] Improved with rehydration with improvement of creatinine prior  to discharge.  Rechecking labs today.   No orders of the defined types were placed in this encounter.   No follow-ups on file.    This visit occurred during the SARS-CoV-2 public health emergency.  Safety protocols were in place, including screening questions prior to the visit, additional usage of staff PPE, and extensive cleaning of exam room while observing appropriate contact time as indicated for disinfecting solutions.

## 2022-01-28 NOTE — Patient Instructions (Signed)
Continue to hold BP medications until you here back from [place] about your kidney function.  Follow up with neurosurgery regarding restrictions for work.

## 2022-01-28 NOTE — Assessment & Plan Note (Signed)
Completed course of antibiotics.  Stable at this time.

## 2022-01-28 NOTE — Assessment & Plan Note (Signed)
Improved with rehydration with improvement of creatinine prior to discharge.  Rechecking labs today.

## 2022-01-28 NOTE — Assessment & Plan Note (Signed)
He is supposed to follow-up with neurosurgery however has not had appointment scheduled.  He will call to schedule appointment if he has not heard anything by next week.  In regards to work I recommend that he remain out of work for now.  If there is prolonged wait to see neurosurgery I think he can probably return to work with restrictions for the time being.

## 2022-01-29 LAB — CBC WITH DIFFERENTIAL/PLATELET
Absolute Monocytes: 520 cells/uL (ref 200–950)
Basophils Absolute: 48 cells/uL (ref 0–200)
Basophils Relative: 0.6 %
Eosinophils Absolute: 128 cells/uL (ref 15–500)
Eosinophils Relative: 1.6 %
HCT: 36.8 % — ABNORMAL LOW (ref 38.5–50.0)
Hemoglobin: 12.7 g/dL — ABNORMAL LOW (ref 13.2–17.1)
Lymphs Abs: 440 cells/uL — ABNORMAL LOW (ref 850–3900)
MCH: 31.4 pg (ref 27.0–33.0)
MCHC: 34.5 g/dL (ref 32.0–36.0)
MCV: 90.9 fL (ref 80.0–100.0)
MPV: 11 fL (ref 7.5–12.5)
Monocytes Relative: 6.5 %
Neutro Abs: 6864 cells/uL (ref 1500–7800)
Neutrophils Relative %: 85.8 %
Platelets: 299 10*3/uL (ref 140–400)
RBC: 4.05 10*6/uL — ABNORMAL LOW (ref 4.20–5.80)
RDW: 12.5 % (ref 11.0–15.0)
Total Lymphocyte: 5.5 %
WBC: 8 10*3/uL (ref 3.8–10.8)

## 2022-01-29 LAB — COMPLETE METABOLIC PANEL WITH GFR
AG Ratio: 1.2 (calc) (ref 1.0–2.5)
ALT: 23 U/L (ref 9–46)
AST: 18 U/L (ref 10–35)
Albumin: 3.6 g/dL (ref 3.6–5.1)
Alkaline phosphatase (APISO): 77 U/L (ref 35–144)
BUN/Creatinine Ratio: 15 (calc) (ref 6–22)
BUN: 23 mg/dL (ref 7–25)
CO2: 29 mmol/L (ref 20–32)
Calcium: 8.8 mg/dL (ref 8.6–10.3)
Chloride: 107 mmol/L (ref 98–110)
Creat: 1.54 mg/dL — ABNORMAL HIGH (ref 0.70–1.35)
Globulin: 2.9 g/dL (calc) (ref 1.9–3.7)
Glucose, Bld: 170 mg/dL — ABNORMAL HIGH (ref 65–99)
Potassium: 4.3 mmol/L (ref 3.5–5.3)
Sodium: 143 mmol/L (ref 135–146)
Total Bilirubin: 0.3 mg/dL (ref 0.2–1.2)
Total Protein: 6.5 g/dL (ref 6.1–8.1)
eGFR: 50 mL/min/{1.73_m2} — ABNORMAL LOW (ref >=60)

## 2022-02-18 ENCOUNTER — Telehealth: Payer: Self-pay | Admitting: Family Medicine

## 2022-02-18 ENCOUNTER — Encounter: Payer: Self-pay | Admitting: Family Medicine

## 2022-02-18 ENCOUNTER — Telehealth (INDEPENDENT_AMBULATORY_CARE_PROVIDER_SITE_OTHER): Payer: BC Managed Care – PPO | Admitting: Family Medicine

## 2022-02-18 DIAGNOSIS — S32010A Wedge compression fracture of first lumbar vertebra, initial encounter for closed fracture: Secondary | ICD-10-CM

## 2022-02-18 NOTE — Progress Notes (Signed)
Jesse Calhoun - 66 y.o. male MRN [id number]  Date of birth: [birth year]   This visit type was conducted due to national recommendations for restrictions regarding the COVID-19 Pandemic (e.g. social distancing).  This format is felt to be most appropriate for this patient at this time.  All issues noted in this document were discussed and addressed.  No physical exam was performed (except for noted visual exam findings with Video Visits).  I discussed the limitations of evaluation and management by telemedicine and the availability of in person appointments. The patient expressed understanding and agreed to proceed.  I connected withNAME@ on 02/18/22 at 11:10 AM EDT by a video enabled telemedicine application and verified that I am speaking with the correct person using two identifiers.  Present at visit: Jesse Nutting, DO Jesse Calhoun   Patient Location: [place] [address]   Provider location:   [place]  No chief complaint on file.   HPI  Jesse Calhoun is a 66 y.o. male who presents via audio/video conferencing for a telehealth visit today.  Jesse Calhoun is following up to discuss FMLA paperwork.  FMLA for his hospitalization expired 02/14/2022.  This is related to his ongoing injury related to syncopal episode and compression fracture of the lumbar spine.  He did attempt to go back to work but could only stay for a couple of hours due to uncontrolled pain.  Commute to [place] for work has been difficult as well.  He does have follow-up with neurosurgery on 03/16/2022.   ROS:  A comprehensive ROS was completed and negative except as noted per HPI  Past Medical History:  Diagnosis Date   Cataract    11/2015 and 12/2015   Diabetes mellitus without complication (HCC)    Hypertension    Prostate cancer ([place]) 05/23/2020    Past Surgical History:  Procedure Laterality Date   KNEE SURGERY     age 89   PROSTATE BIOPSY      Family History  Problem Relation Age of  Onset   Diabetes Mother    Cancer Father        unknown type "possibly lung"   Breast cancer Neg Hx    Prostate cancer Neg Hx    Colon cancer Neg Hx    Pancreatic cancer Neg Hx     Social History   Socioeconomic History   Marital status: Unknown    Spouse name: Not on file   Number of children: 0   Years of education: Not on file   Highest education level: Not on file  Occupational History   Not on file  Tobacco Use   Smoking status: Heavy Smoker    Packs/day: 5.00    Years: 0.00    Total pack years: 0.00    Types: Cigars, Cigarettes   Smokeless tobacco: Never   Tobacco comments:    smokes approximately 6-8 cigars per day  Vaping Use   Vaping Use: Never used  Substance and Sexual Activity   Alcohol use: No    Alcohol/week: 0.0 standard drinks of alcohol   Drug use: No   Sexual activity: Not Currently  Other Topics Concern   Not on file  Social History Narrative   Not on file   Social Determinants of Health   Financial Resource Strain: Low Risk  (07/14/2021)   Overall Financial Resource Strain (CARDIA)    Difficulty of Paying Living Expenses: Not hard at all  Food Insecurity: No Food Insecurity (07/14/2021)  Hunger Vital Sign    Worried About Running Out of Food in the Last Year: Never true    Ran Out of Food in the Last Year: Never true  Transportation Needs: No Transportation Needs (07/14/2021)   PRAPARE - Hydrologist (Medical): No    Lack of Transportation (Non-Medical): No  Physical Activity: Inactive (07/14/2021)   Exercise Vital Sign    Days of Exercise per Week: 0 days    Minutes of Exercise per Session: 0 min  Stress: No Stress Concern Present (07/14/2021)   [place]    Feeling of Stress : Not at all  Social Connections: Unknown (07/14/2021)   Social Connection and Isolation Panel [NHANES]    Frequency of Communication with Friends and Family: Three times a  week    Frequency of Social Gatherings with Friends and Family: Twice a week    Attends Religious Services: Never    Marine scientist or Organizations: No    Attends Archivist Meetings: Never    Marital Status: Not on file  Intimate Partner Violence: Not At Risk (07/14/2021)   Humiliation, Afraid, Rape, and Kick questionnaire    Fear of Current or Ex-Partner: No    Emotionally Abused: No    Physically Abused: No    Sexually Abused: No     Current Outpatient Medications:    ACCU-CHEK AVIVA PLUS test strip, USE 1 TEST STRIP TO TEST TWO TIMES A DAY, Disp: 200 strip, Rfl: 97   Accu-Chek Softclix Lancets lancets, CHECK BLOOD SUGARS TWO TIMES A DAY, Disp: 100 each, Rfl: PRN   acetaminophen (TYLENOL) 650 MG CR tablet, Take 650 mg by mouth every 8 (eight) hours as needed for pain. Taking 2 tablets BID, Disp: , Rfl:    atorvastatin (LIPITOR) 80 MG tablet, TAKE ONE TABLET BY MOUTH DAILY, Disp: 90 tablet, Rfl: 1   Insulin Glargine (BASAGLAR KWIKPEN) 100 UNIT/ML, Inject 55 Units into the skin daily., Disp: 30 mL, Rfl: 11   Insulin Pen Needle (B-D ULTRAFINE III SHORT PEN) 31G X 8 MM MISC, Use to inject insulin daily, Disp: 100 each, Rfl: 97   lisinopril (ZESTRIL) 40 MG tablet, TAKE ONE TABLET BY MOUTH DAILY (Patient taking differently: Take 20 mg by mouth daily.), Disp: 90 tablet, Rfl: 1  EXAM:  VITALS per patient if applicable: There were no vitals taken for this visit.  GENERAL: alert, oriented, appears well and in no acute distress  HEENT: atraumatic, conjunttiva clear, no obvious abnormalities on inspection of external nose and ears  NECK: normal movements of the head and neck  LUNGS: on inspection no signs of respiratory distress, breathing rate appears normal, no obvious gross SOB, gasping or wheezing  CV: no obvious cyanosis  MS: moves all visible extremities without noticeable abnormality  PSYCH/NEURO: pleasant and cooperative, no obvious depression or anxiety,  speech and thought processing grossly intact  ASSESSMENT AND PLAN:  Discussed the following assessment and plan:  Compression fracture of L1 lumbar vertebra, closed, initial encounter ([place]) Continues to have quite a bit of pain related to compression fracture.  Did try returning to work however was unable to stay for a significant period of time.  He is needing additional FMLA paperwork completed for time away from work until he is seen by neurosurgery.     I discussed the assessment and treatment plan with the patient. The patient was provided an opportunity to ask questions and all  were answered. The patient agreed with the plan and demonstrated an understanding of the instructions.   The patient was advised to call back or seek an in-person evaluation if the symptoms worsen or if the condition fails to improve as anticipated.    Jesse Nutting, DO

## 2022-02-18 NOTE — Assessment & Plan Note (Signed)
Continues to have quite a bit of pain related to compression fracture.  Did try returning to work however was unable to stay for a significant period of time.  He is needing additional FMLA paperwork completed for time away from work until he is seen by neurosurgery.

## 2022-02-18 NOTE — Telephone Encounter (Signed)
Patient dropped off FMLA paperwork to be completed by PCP. Patient was informed of possible fee and 3-5 day turn around. Paperwork place in provider's box. Jesse Calhoun

## 2022-02-25 NOTE — Telephone Encounter (Signed)
Completed FMLA form faxed to [phone]. Confirmation rec'd. Original form placed at front office desk to contact patient to p/u their form. A copy of the form placed in medical assistant's folder.

## 2022-03-16 DIAGNOSIS — S32010A Wedge compression fracture of first lumbar vertebra, initial encounter for closed fracture: Secondary | ICD-10-CM | POA: Diagnosis not present

## 2022-03-16 DIAGNOSIS — S22080A Wedge compression fracture of T11-T12 vertebra, initial encounter for closed fracture: Secondary | ICD-10-CM | POA: Diagnosis not present

## 2022-03-16 DIAGNOSIS — M546 Pain in thoracic spine: Secondary | ICD-10-CM | POA: Diagnosis not present

## 2022-04-13 DIAGNOSIS — S32010A Wedge compression fracture of first lumbar vertebra, initial encounter for closed fracture: Secondary | ICD-10-CM | POA: Diagnosis not present

## 2022-05-03 DIAGNOSIS — M6281 Muscle weakness (generalized): Secondary | ICD-10-CM | POA: Diagnosis not present

## 2022-05-03 DIAGNOSIS — R293 Abnormal posture: Secondary | ICD-10-CM | POA: Diagnosis not present

## 2022-05-03 DIAGNOSIS — M545 Low back pain, unspecified: Secondary | ICD-10-CM | POA: Diagnosis not present

## 2022-05-03 DIAGNOSIS — M546 Pain in thoracic spine: Secondary | ICD-10-CM | POA: Diagnosis not present

## 2022-05-06 DIAGNOSIS — R293 Abnormal posture: Secondary | ICD-10-CM | POA: Diagnosis not present

## 2022-05-06 DIAGNOSIS — M6281 Muscle weakness (generalized): Secondary | ICD-10-CM | POA: Diagnosis not present

## 2022-05-06 DIAGNOSIS — M545 Low back pain, unspecified: Secondary | ICD-10-CM | POA: Diagnosis not present

## 2022-05-06 DIAGNOSIS — M546 Pain in thoracic spine: Secondary | ICD-10-CM | POA: Diagnosis not present

## 2022-05-10 DIAGNOSIS — R293 Abnormal posture: Secondary | ICD-10-CM | POA: Diagnosis not present

## 2022-05-10 DIAGNOSIS — M546 Pain in thoracic spine: Secondary | ICD-10-CM | POA: Diagnosis not present

## 2022-05-10 DIAGNOSIS — M545 Low back pain, unspecified: Secondary | ICD-10-CM | POA: Diagnosis not present

## 2022-05-10 DIAGNOSIS — M6281 Muscle weakness (generalized): Secondary | ICD-10-CM | POA: Diagnosis not present

## 2022-05-12 DIAGNOSIS — M6281 Muscle weakness (generalized): Secondary | ICD-10-CM | POA: Diagnosis not present

## 2022-05-12 DIAGNOSIS — R293 Abnormal posture: Secondary | ICD-10-CM | POA: Diagnosis not present

## 2022-05-12 DIAGNOSIS — M546 Pain in thoracic spine: Secondary | ICD-10-CM | POA: Diagnosis not present

## 2022-05-12 DIAGNOSIS — M545 Low back pain, unspecified: Secondary | ICD-10-CM | POA: Diagnosis not present

## 2022-05-17 DIAGNOSIS — M6281 Muscle weakness (generalized): Secondary | ICD-10-CM | POA: Diagnosis not present

## 2022-05-17 DIAGNOSIS — M545 Low back pain, unspecified: Secondary | ICD-10-CM | POA: Diagnosis not present

## 2022-05-17 DIAGNOSIS — M546 Pain in thoracic spine: Secondary | ICD-10-CM | POA: Diagnosis not present

## 2022-05-17 DIAGNOSIS — R293 Abnormal posture: Secondary | ICD-10-CM | POA: Diagnosis not present

## 2022-05-19 DIAGNOSIS — C61 Malignant neoplasm of prostate: Secondary | ICD-10-CM | POA: Diagnosis not present

## 2022-05-20 DIAGNOSIS — M546 Pain in thoracic spine: Secondary | ICD-10-CM | POA: Diagnosis not present

## 2022-05-20 DIAGNOSIS — R293 Abnormal posture: Secondary | ICD-10-CM | POA: Diagnosis not present

## 2022-05-20 DIAGNOSIS — M6281 Muscle weakness (generalized): Secondary | ICD-10-CM | POA: Diagnosis not present

## 2022-05-20 DIAGNOSIS — M545 Low back pain, unspecified: Secondary | ICD-10-CM | POA: Diagnosis not present

## 2022-05-23 DIAGNOSIS — M546 Pain in thoracic spine: Secondary | ICD-10-CM | POA: Diagnosis not present

## 2022-05-23 DIAGNOSIS — M6281 Muscle weakness (generalized): Secondary | ICD-10-CM | POA: Diagnosis not present

## 2022-05-23 DIAGNOSIS — R293 Abnormal posture: Secondary | ICD-10-CM | POA: Diagnosis not present

## 2022-05-23 DIAGNOSIS — M545 Low back pain, unspecified: Secondary | ICD-10-CM | POA: Diagnosis not present

## 2022-05-24 ENCOUNTER — Encounter: Payer: Self-pay | Admitting: Family Medicine

## 2022-05-24 ENCOUNTER — Ambulatory Visit: Payer: 59 | Admitting: Family Medicine

## 2022-05-24 VITALS — BP 124/71 | HR 77 | Ht 68.0 in | Wt 219.0 lb

## 2022-05-24 DIAGNOSIS — S32010A Wedge compression fracture of first lumbar vertebra, initial encounter for closed fracture: Secondary | ICD-10-CM | POA: Insufficient documentation

## 2022-05-24 DIAGNOSIS — Z794 Long term (current) use of insulin: Secondary | ICD-10-CM

## 2022-05-24 DIAGNOSIS — E119 Type 2 diabetes mellitus without complications: Secondary | ICD-10-CM

## 2022-05-24 DIAGNOSIS — I10 Essential (primary) hypertension: Secondary | ICD-10-CM | POA: Diagnosis not present

## 2022-05-24 DIAGNOSIS — Z23 Encounter for immunization: Secondary | ICD-10-CM | POA: Diagnosis not present

## 2022-05-24 DIAGNOSIS — S22080A Wedge compression fracture of T11-T12 vertebra, initial encounter for closed fracture: Secondary | ICD-10-CM | POA: Insufficient documentation

## 2022-05-24 LAB — POCT GLYCOSYLATED HEMOGLOBIN (HGB A1C): HbA1c, POC (controlled diabetic range): 7.4 % — AB (ref 0.0–7.0)

## 2022-05-24 NOTE — Assessment & Plan Note (Signed)
BP remains well controlled with lisinopril '20mg'$  daily. Recommend continuation at current strength.

## 2022-05-24 NOTE — Assessment & Plan Note (Signed)
Continues to have some pain.  Currently doing PT with some improvement.  Discussed limiting bending at the waist and heavy lifting.  Recommend tylenol for pain control.

## 2022-05-24 NOTE — Patient Instructions (Signed)

## 2022-05-24 NOTE — Progress Notes (Signed)
Jesse Calhoun - 66 y.o. male MRN [id number]  Date of birth: [birth year]  Subjective Chief Complaint  Patient presents with   Diabetes    HPI Jesse Calhoun is a 66 y.o. male here today for follow up visit.   Reports that he is doing well at this time. He has healed well from his compression fracture.    Continues on basaglar 55 units daily for management of diabetes.  Blood sugars at home have looked pretty good.  Denies hypoglycemia.    Remains on lisinopril '20mg'$  daily for management of HTN. Denies symptoms of hypotension.  He has not had chest pain, shortness of breath, palpitations, headache or vision changes.   ROS:  A comprehensive ROS was completed and negative except as noted per HPI  No Known Allergies  Past Medical History:  Diagnosis Date   Cataract    11/2015 and 12/2015   Diabetes mellitus without complication (HCC)    Hypertension    Prostate cancer ([place]) 05/23/2020    Past Surgical History:  Procedure Laterality Date   KNEE SURGERY     age 72   PROSTATE BIOPSY      Social History   Socioeconomic History   Marital status: Unknown    Spouse name: Not on file   Number of children: 0   Years of education: Not on file   Highest education level: Not on file  Occupational History   Not on file  Tobacco Use   Smoking status: Heavy Smoker    Packs/day: 5.00    Years: 0.00    Total pack years: 0.00    Types: Cigars, Cigarettes   Smokeless tobacco: Never   Tobacco comments:    smokes approximately 6-8 cigars per day  Vaping Use   Vaping Use: Never used  Substance and Sexual Activity   Alcohol use: No    Alcohol/week: 0.0 standard drinks of alcohol   Drug use: No   Sexual activity: Not Currently  Other Topics Concern   Not on file  Social History Narrative   Not on file   Social Determinants of Health   Financial Resource Strain: Low Risk  (07/14/2021)   Overall Financial Resource Strain (CARDIA)    Difficulty of Paying Living Expenses: Not hard at  all  Food Insecurity: No Food Insecurity (07/14/2021)   Hunger Vital Sign    Worried About Running Out of Food in the Last Year: Never true    Ran Out of Food in the Last Year: Never true  Transportation Needs: No Transportation Needs (07/14/2021)   PRAPARE - Hydrologist (Medical): No    Lack of Transportation (Non-Medical): No  Physical Activity: Inactive (07/14/2021)   Exercise Vital Sign    Days of Exercise per Week: 0 days    Minutes of Exercise per Session: 0 min  Stress: No Stress Concern Present (07/14/2021)   [place]    Feeling of Stress : Not at all  Social Connections: Unknown (07/14/2021)   Social Connection and Isolation Panel [NHANES]    Frequency of Communication with Friends and Family: Three times a week    Frequency of Social Gatherings with Friends and Family: Twice a week    Attends Religious Services: Never    Marine scientist or Organizations: No    Attends Archivist Meetings: Never    Marital Status: Not on file    Family History  Problem Relation Age  of Onset   Diabetes Mother    Cancer Father        unknown type "possibly lung"   Breast cancer Neg Hx    Prostate cancer Neg Hx    Colon cancer Neg Hx    Pancreatic cancer Neg Hx     Health Maintenance  Topic Date Due   Zoster Vaccines- Shingrix (1 of 2) Never done   OPHTHALMOLOGY EXAM  02/02/2021   INFLUENZA VACCINE  02/02/2022   HEMOGLOBIN A1C  05/22/2022   COVID-19 Vaccine (4 - Pfizer risk series) 08/05/2022 (Originally 05/30/2020)   Pneumonia Vaccine 20+ Years old (2 - PCV) 09/24/2022 (Originally 06/02/2016)   COLONOSCOPY (Pts 45-40yr Insurance coverage will need to be confirmed)  09/24/2022 (Originally 07/27/2001)   Diabetic kidney evaluation - Urine ACR  11/20/2022   Diabetic kidney evaluation - GFR measurement  01/29/2023   FOOT EXAM  05/25/2023   Hepatitis C Screening  Completed    HIV Screening  Completed   HPV VACCINES  Aged Out     ----------------------------------------------------------------------------------------------------------------------------------------------------------------------------------------------------------------- Physical Exam BP 124/71 (BP Location: Left Arm, Patient Position: Sitting, Cuff Size: Large)   Pulse 77   Ht '5\' 8"'$  (1.727 m)   Wt 219 lb (99.3 kg)   SpO2 99%   BMI 33.30 kg/m   Physical Exam Constitutional:      Appearance: Normal appearance.  HENT:     Head: Normocephalic and atraumatic.  Eyes:     General: No scleral icterus. Cardiovascular:     Rate and Rhythm: Normal rate and regular rhythm.  Pulmonary:     Effort: Pulmonary effort is normal.     Breath sounds: Normal breath sounds.  Musculoskeletal:     Cervical back: Neck supple.  Neurological:     Mental Status: He is alert.  Psychiatric:        Mood and Affect: Mood normal.        Behavior: Behavior normal.     ------------------------------------------------------------------------------------------------------------------------------------------------------------------------------------------------------------------- Assessment and Plan  Essential hypertension BP remains well controlled with lisinopril '20mg'$  daily. Recommend continuation at current strength.    Type 2 diabetes mellitus without complication, with long-term current use of insulin (HCC) A1c is up slightly compared to previous, which isn't bad given his limited activity.  He will continue to incorporate more activity over the next few month and work on dietary change.  F/u in 4 months.   Compression fracture of L1 lumbar vertebra, closed, initial encounter ([facility] Continues to have some pain.  Currently doing PT with some improvement.  Discussed limiting bending at the waist and heavy lifting.  Recommend tylenol for pain control.     No orders of the defined types were placed in this  encounter.   No follow-ups on file.    This visit occurred during the SARS-CoV-2 public health emergency.  Safety protocols were in place, including screening questions prior to the visit, additional usage of staff PPE, and extensive cleaning of exam room while observing appropriate contact time as indicated for disinfecting solutions.

## 2022-05-24 NOTE — Assessment & Plan Note (Signed)
A1c is up slightly compared to previous, which isn't bad given his limited activity.  He will continue to incorporate more activity over the next few month and work on dietary change.  F/u in 4 months.

## 2022-05-25 DIAGNOSIS — M546 Pain in thoracic spine: Secondary | ICD-10-CM | POA: Diagnosis not present

## 2022-05-25 DIAGNOSIS — R293 Abnormal posture: Secondary | ICD-10-CM | POA: Diagnosis not present

## 2022-05-25 DIAGNOSIS — M6281 Muscle weakness (generalized): Secondary | ICD-10-CM | POA: Diagnosis not present

## 2022-05-25 DIAGNOSIS — M545 Low back pain, unspecified: Secondary | ICD-10-CM | POA: Diagnosis not present

## 2022-05-27 ENCOUNTER — Other Ambulatory Visit: Payer: Self-pay | Admitting: Family Medicine

## 2022-05-27 DIAGNOSIS — E119 Type 2 diabetes mellitus without complications: Secondary | ICD-10-CM

## 2022-05-31 DIAGNOSIS — M6281 Muscle weakness (generalized): Secondary | ICD-10-CM | POA: Diagnosis not present

## 2022-05-31 DIAGNOSIS — M545 Low back pain, unspecified: Secondary | ICD-10-CM | POA: Diagnosis not present

## 2022-05-31 DIAGNOSIS — R293 Abnormal posture: Secondary | ICD-10-CM | POA: Diagnosis not present

## 2022-05-31 DIAGNOSIS — M546 Pain in thoracic spine: Secondary | ICD-10-CM | POA: Diagnosis not present

## 2022-06-01 DIAGNOSIS — Z5111 Encounter for antineoplastic chemotherapy: Secondary | ICD-10-CM | POA: Diagnosis not present

## 2022-06-01 DIAGNOSIS — C61 Malignant neoplasm of prostate: Secondary | ICD-10-CM | POA: Diagnosis not present

## 2022-06-03 DIAGNOSIS — M546 Pain in thoracic spine: Secondary | ICD-10-CM | POA: Diagnosis not present

## 2022-06-03 DIAGNOSIS — M545 Low back pain, unspecified: Secondary | ICD-10-CM | POA: Diagnosis not present

## 2022-06-03 DIAGNOSIS — R293 Abnormal posture: Secondary | ICD-10-CM | POA: Diagnosis not present

## 2022-06-03 DIAGNOSIS — M6281 Muscle weakness (generalized): Secondary | ICD-10-CM | POA: Diagnosis not present

## 2022-06-07 DIAGNOSIS — R293 Abnormal posture: Secondary | ICD-10-CM | POA: Diagnosis not present

## 2022-06-07 DIAGNOSIS — M545 Low back pain, unspecified: Secondary | ICD-10-CM | POA: Diagnosis not present

## 2022-06-07 DIAGNOSIS — M546 Pain in thoracic spine: Secondary | ICD-10-CM | POA: Diagnosis not present

## 2022-06-07 DIAGNOSIS — M6281 Muscle weakness (generalized): Secondary | ICD-10-CM | POA: Diagnosis not present

## 2022-06-08 ENCOUNTER — Other Ambulatory Visit: Payer: Self-pay

## 2022-06-08 DIAGNOSIS — Z794 Long term (current) use of insulin: Secondary | ICD-10-CM

## 2022-06-08 MED ORDER — LISINOPRIL 40 MG PO TABS: 40.0000 mg | ORAL_TABLET | Freq: Every day | ORAL | 1 refills | Status: DC

## 2022-06-08 MED ORDER — ATORVASTATIN CALCIUM 80 MG PO TABS: 80.0000 mg | ORAL_TABLET | Freq: Every day | ORAL | 1 refills | Status: DC

## 2022-06-08 MED ORDER — BD PEN NEEDLE SHORT U/F 31G X 8 MM MISC: 97 refills | Status: DC

## 2022-06-09 DIAGNOSIS — M546 Pain in thoracic spine: Secondary | ICD-10-CM | POA: Diagnosis not present

## 2022-06-09 DIAGNOSIS — M6281 Muscle weakness (generalized): Secondary | ICD-10-CM | POA: Diagnosis not present

## 2022-06-09 DIAGNOSIS — M545 Low back pain, unspecified: Secondary | ICD-10-CM | POA: Diagnosis not present

## 2022-06-09 DIAGNOSIS — R293 Abnormal posture: Secondary | ICD-10-CM | POA: Diagnosis not present

## 2022-06-14 DIAGNOSIS — R293 Abnormal posture: Secondary | ICD-10-CM | POA: Diagnosis not present

## 2022-06-14 DIAGNOSIS — M546 Pain in thoracic spine: Secondary | ICD-10-CM | POA: Diagnosis not present

## 2022-06-14 DIAGNOSIS — M545 Low back pain, unspecified: Secondary | ICD-10-CM | POA: Diagnosis not present

## 2022-06-14 DIAGNOSIS — M6281 Muscle weakness (generalized): Secondary | ICD-10-CM | POA: Diagnosis not present

## 2022-06-16 DIAGNOSIS — M546 Pain in thoracic spine: Secondary | ICD-10-CM | POA: Diagnosis not present

## 2022-06-16 DIAGNOSIS — R293 Abnormal posture: Secondary | ICD-10-CM | POA: Diagnosis not present

## 2022-06-16 DIAGNOSIS — M545 Low back pain, unspecified: Secondary | ICD-10-CM | POA: Diagnosis not present

## 2022-06-16 DIAGNOSIS — M6281 Muscle weakness (generalized): Secondary | ICD-10-CM | POA: Diagnosis not present

## 2022-06-20 ENCOUNTER — Other Ambulatory Visit: Payer: Self-pay | Admitting: Family Medicine

## 2022-06-20 DIAGNOSIS — E119 Type 2 diabetes mellitus without complications: Secondary | ICD-10-CM

## 2022-06-21 DIAGNOSIS — M546 Pain in thoracic spine: Secondary | ICD-10-CM | POA: Diagnosis not present

## 2022-06-21 DIAGNOSIS — R293 Abnormal posture: Secondary | ICD-10-CM | POA: Diagnosis not present

## 2022-06-21 DIAGNOSIS — M6281 Muscle weakness (generalized): Secondary | ICD-10-CM | POA: Diagnosis not present

## 2022-06-21 DIAGNOSIS — M545 Low back pain, unspecified: Secondary | ICD-10-CM | POA: Diagnosis not present

## 2022-06-23 DIAGNOSIS — R293 Abnormal posture: Secondary | ICD-10-CM | POA: Diagnosis not present

## 2022-06-23 DIAGNOSIS — M545 Low back pain, unspecified: Secondary | ICD-10-CM | POA: Diagnosis not present

## 2022-06-23 DIAGNOSIS — M546 Pain in thoracic spine: Secondary | ICD-10-CM | POA: Diagnosis not present

## 2022-06-23 DIAGNOSIS — M6281 Muscle weakness (generalized): Secondary | ICD-10-CM | POA: Diagnosis not present

## 2022-06-30 DIAGNOSIS — M546 Pain in thoracic spine: Secondary | ICD-10-CM | POA: Diagnosis not present

## 2022-06-30 DIAGNOSIS — M545 Low back pain, unspecified: Secondary | ICD-10-CM | POA: Diagnosis not present

## 2022-06-30 DIAGNOSIS — R293 Abnormal posture: Secondary | ICD-10-CM | POA: Diagnosis not present

## 2022-06-30 DIAGNOSIS — M6281 Muscle weakness (generalized): Secondary | ICD-10-CM | POA: Diagnosis not present

## 2022-07-02 DIAGNOSIS — M6281 Muscle weakness (generalized): Secondary | ICD-10-CM | POA: Diagnosis not present

## 2022-07-02 DIAGNOSIS — M546 Pain in thoracic spine: Secondary | ICD-10-CM | POA: Diagnosis not present

## 2022-07-02 DIAGNOSIS — R293 Abnormal posture: Secondary | ICD-10-CM | POA: Diagnosis not present

## 2022-07-02 DIAGNOSIS — M545 Low back pain, unspecified: Secondary | ICD-10-CM | POA: Diagnosis not present

## 2022-07-07 DIAGNOSIS — M546 Pain in thoracic spine: Secondary | ICD-10-CM | POA: Diagnosis not present

## 2022-07-07 DIAGNOSIS — R293 Abnormal posture: Secondary | ICD-10-CM | POA: Diagnosis not present

## 2022-07-07 DIAGNOSIS — M545 Low back pain, unspecified: Secondary | ICD-10-CM | POA: Diagnosis not present

## 2022-07-07 DIAGNOSIS — M6281 Muscle weakness (generalized): Secondary | ICD-10-CM | POA: Diagnosis not present

## 2022-07-09 DIAGNOSIS — M6281 Muscle weakness (generalized): Secondary | ICD-10-CM | POA: Diagnosis not present

## 2022-07-09 DIAGNOSIS — R293 Abnormal posture: Secondary | ICD-10-CM | POA: Diagnosis not present

## 2022-07-09 DIAGNOSIS — M545 Low back pain, unspecified: Secondary | ICD-10-CM | POA: Diagnosis not present

## 2022-07-09 DIAGNOSIS — M546 Pain in thoracic spine: Secondary | ICD-10-CM | POA: Diagnosis not present

## 2022-07-14 DIAGNOSIS — R293 Abnormal posture: Secondary | ICD-10-CM | POA: Diagnosis not present

## 2022-07-14 DIAGNOSIS — M6281 Muscle weakness (generalized): Secondary | ICD-10-CM | POA: Diagnosis not present

## 2022-07-14 DIAGNOSIS — M546 Pain in thoracic spine: Secondary | ICD-10-CM | POA: Diagnosis not present

## 2022-07-14 DIAGNOSIS — M545 Low back pain, unspecified: Secondary | ICD-10-CM | POA: Diagnosis not present

## 2022-07-15 DIAGNOSIS — E291 Testicular hypofunction: Secondary | ICD-10-CM | POA: Diagnosis not present

## 2022-07-15 DIAGNOSIS — C61 Malignant neoplasm of prostate: Secondary | ICD-10-CM | POA: Diagnosis not present

## 2022-07-16 DIAGNOSIS — R293 Abnormal posture: Secondary | ICD-10-CM | POA: Diagnosis not present

## 2022-07-16 DIAGNOSIS — M6281 Muscle weakness (generalized): Secondary | ICD-10-CM | POA: Diagnosis not present

## 2022-07-16 DIAGNOSIS — M545 Low back pain, unspecified: Secondary | ICD-10-CM | POA: Diagnosis not present

## 2022-07-16 DIAGNOSIS — M546 Pain in thoracic spine: Secondary | ICD-10-CM | POA: Diagnosis not present

## 2022-07-21 DIAGNOSIS — M6281 Muscle weakness (generalized): Secondary | ICD-10-CM | POA: Diagnosis not present

## 2022-07-21 DIAGNOSIS — R293 Abnormal posture: Secondary | ICD-10-CM | POA: Diagnosis not present

## 2022-07-21 DIAGNOSIS — M546 Pain in thoracic spine: Secondary | ICD-10-CM | POA: Diagnosis not present

## 2022-07-21 DIAGNOSIS — M545 Low back pain, unspecified: Secondary | ICD-10-CM | POA: Diagnosis not present

## 2022-07-23 DIAGNOSIS — M6281 Muscle weakness (generalized): Secondary | ICD-10-CM | POA: Diagnosis not present

## 2022-07-23 DIAGNOSIS — R293 Abnormal posture: Secondary | ICD-10-CM | POA: Diagnosis not present

## 2022-07-23 DIAGNOSIS — M545 Low back pain, unspecified: Secondary | ICD-10-CM | POA: Diagnosis not present

## 2022-07-23 DIAGNOSIS — M546 Pain in thoracic spine: Secondary | ICD-10-CM | POA: Diagnosis not present

## 2022-08-02 DIAGNOSIS — R293 Abnormal posture: Secondary | ICD-10-CM | POA: Diagnosis not present

## 2022-08-02 DIAGNOSIS — M6281 Muscle weakness (generalized): Secondary | ICD-10-CM | POA: Diagnosis not present

## 2022-08-02 DIAGNOSIS — M546 Pain in thoracic spine: Secondary | ICD-10-CM | POA: Diagnosis not present

## 2022-08-02 DIAGNOSIS — M545 Low back pain, unspecified: Secondary | ICD-10-CM | POA: Diagnosis not present

## 2022-08-04 DIAGNOSIS — M545 Low back pain, unspecified: Secondary | ICD-10-CM | POA: Diagnosis not present

## 2022-08-04 DIAGNOSIS — R293 Abnormal posture: Secondary | ICD-10-CM | POA: Diagnosis not present

## 2022-08-04 DIAGNOSIS — M546 Pain in thoracic spine: Secondary | ICD-10-CM | POA: Diagnosis not present

## 2022-08-04 DIAGNOSIS — M6281 Muscle weakness (generalized): Secondary | ICD-10-CM | POA: Diagnosis not present

## 2022-09-27 ENCOUNTER — Ambulatory Visit (INDEPENDENT_AMBULATORY_CARE_PROVIDER_SITE_OTHER): Payer: BC Managed Care – PPO | Admitting: Family Medicine

## 2022-09-27 ENCOUNTER — Telehealth: Payer: Self-pay | Admitting: Family Medicine

## 2022-09-27 ENCOUNTER — Encounter: Payer: Self-pay | Admitting: Family Medicine

## 2022-09-27 VITALS — BP 112/70 | HR 80 | Ht 68.0 in | Wt 223.0 lb

## 2022-09-27 DIAGNOSIS — S22080S Wedge compression fracture of T11-T12 vertebra, sequela: Secondary | ICD-10-CM | POA: Diagnosis not present

## 2022-09-27 DIAGNOSIS — Z23 Encounter for immunization: Secondary | ICD-10-CM | POA: Diagnosis not present

## 2022-09-27 DIAGNOSIS — I10 Essential (primary) hypertension: Secondary | ICD-10-CM

## 2022-09-27 DIAGNOSIS — E1169 Type 2 diabetes mellitus with other specified complication: Secondary | ICD-10-CM | POA: Diagnosis not present

## 2022-09-27 DIAGNOSIS — E782 Mixed hyperlipidemia: Secondary | ICD-10-CM | POA: Diagnosis not present

## 2022-09-27 DIAGNOSIS — Z794 Long term (current) use of insulin: Secondary | ICD-10-CM

## 2022-09-27 DIAGNOSIS — E119 Type 2 diabetes mellitus without complications: Secondary | ICD-10-CM

## 2022-09-27 DIAGNOSIS — E785 Hyperlipidemia, unspecified: Secondary | ICD-10-CM

## 2022-09-27 DIAGNOSIS — S32010S Wedge compression fracture of first lumbar vertebra, sequela: Secondary | ICD-10-CM

## 2022-09-27 LAB — POCT GLYCOSYLATED HEMOGLOBIN (HGB A1C): HbA1c, POC (controlled diabetic range): 7.8 % — AB (ref 0.0–7.0)

## 2022-09-27 MED ORDER — BASAGLAR KWIKPEN 100 UNIT/ML ~~LOC~~ SOPN: 30.0000 [IU] | PEN_INJECTOR | Freq: Two times a day (BID) | SUBCUTANEOUS | 1 refills | Status: DC

## 2022-09-27 NOTE — Assessment & Plan Note (Signed)
Checking vitamin d levels.  Androgen deprivation for his prostate cancer may have contributed as well.

## 2022-09-27 NOTE — Assessment & Plan Note (Signed)
Tolerating atorvastatin at current strength.  Update lipid panel.

## 2022-09-27 NOTE — Patient Instructions (Signed)
Change basaglar to 30 units twice per day.  Continue to work on dietary changes.  See me again in about 4 months.

## 2022-09-27 NOTE — Assessment & Plan Note (Signed)
BP remains well controlled with lisinopril at current strength. Recommend continuation.

## 2022-09-27 NOTE — Progress Notes (Signed)
Jesse Calhoun - 67 y.o. male MRN [id number]  Date of birth: 05-02-1956  Subjective Chief Complaint  Patient presents with   Diabetes   Hypertension    HPI Jesse Calhoun is a 67 y.o. male here today for follow up visit.   He reports that he is doing ok. Continues to have some pain in his back which limits his exercise.    He continues on lisinopril for management of HTN.  He denies side effects of this at current strength.  BP is well controlled.  Denies chest pain, shortness of breath, palpitations, headache or vision changes.   Remains on basaglar at 55 units daily.  He is using this as directed.  Blood sugars at home have been elevated.  Admits that diet could be better.  Tolerating atorvastatin well for associated HLD.    ROS:  A comprehensive ROS was completed and negative except as noted per HPI  No Known Allergies  Past Medical History:  Diagnosis Date   Cataract    11/2015 and 12/2015   Diabetes mellitus without complication (HCC)    Hypertension    Prostate cancer ([place]) 05/23/2020    Past Surgical History:  Procedure Laterality Date   KNEE SURGERY     age 63   PROSTATE BIOPSY      Social History   Socioeconomic History   Marital status: Unknown    Spouse name: Not on file   Number of children: 0   Years of education: Not on file   Highest education level: Not on file  Occupational History   Not on file  Tobacco Use   Smoking status: Heavy Smoker    Packs/day: 5.00    Years: 0.00    Additional pack years: 0.00    Total pack years: 0.00    Types: Cigars, Cigarettes   Smokeless tobacco: Never   Tobacco comments:    smokes approximately 6-8 cigars per day  Vaping Use   Vaping Use: Never used  Substance and Sexual Activity   Alcohol use: No    Alcohol/week: 0.0 standard drinks of alcohol   Drug use: No   Sexual activity: Not Currently  Other Topics Concern   Not on file  Social History Narrative   Not on file   Social Determinants of Health    Financial Resource Strain: Low Risk  (07/14/2021)   Overall Financial Resource Strain (CARDIA)    Difficulty of Paying Living Expenses: Not hard at all  Food Insecurity: No Food Insecurity (07/14/2021)   Hunger Vital Sign    Worried About Running Out of Food in the Last Year: Never true    Ran Out of Food in the Last Year: Never true  Transportation Needs: No Transportation Needs (07/14/2021)   PRAPARE - Hydrologist (Medical): No    Lack of Transportation (Non-Medical): No  Physical Activity: Inactive (07/14/2021)   Exercise Vital Sign    Days of Exercise per Week: 0 days    Minutes of Exercise per Session: 0 min  Stress: No Stress Concern Present (07/14/2021)   [place]    Feeling of Stress : Not at all  Social Connections: Unknown (07/14/2021)   Social Connection and Isolation Panel [NHANES]    Frequency of Communication with Friends and Family: Three times a week    Frequency of Social Gatherings with Friends and Family: Twice a week    Attends Religious Services: Never  Active Member of Clubs or Organizations: No    Attends Archivist Meetings: Never    Marital Status: Not on file    Family History  Problem Relation Age of Onset   Diabetes Mother    Cancer Father        unknown type "possibly lung"   Breast cancer Neg Hx    Prostate cancer Neg Hx    Colon cancer Neg Hx    Pancreatic cancer Neg Hx     Health Maintenance  Topic Date Due   OPHTHALMOLOGY EXAM  03/30/2023 (Originally 02/02/2021)   COVID-19 Vaccine (4 - 2023-24 season) 03/30/2023 (Originally 03/05/2022)   COLONOSCOPY (Pts 45-35yrs Insurance coverage will need to be confirmed)  03/29/2024 (Originally 07/27/2001)   Diabetic kidney evaluation - Urine ACR  11/20/2022   Zoster Vaccines- Shingrix (2 of 2) 11/22/2022   Diabetic kidney evaluation - eGFR measurement  01/29/2023   HEMOGLOBIN A1C  03/30/2023   FOOT  EXAM  05/25/2023   DTaP/Tdap/Td (2 - Td or Tdap) 06/02/2025   Pneumonia Vaccine 73+ Years old  Completed   INFLUENZA VACCINE  Completed   Hepatitis C Screening  Completed   HPV VACCINES  Aged Out     ----------------------------------------------------------------------------------------------------------------------------------------------------------------------------------------------------------------- Physical Exam BP 112/70 (BP Location: Left Arm, Patient Position: Sitting, Cuff Size: Large)   Pulse 80   Ht 5\' 8"  (1.727 m)   Wt 223 lb (101.2 kg)   SpO2 98%   BMI 33.91 kg/m   Physical Exam Constitutional:      Appearance: Normal appearance.  HENT:     Head: Normocephalic and atraumatic.  Eyes:     General: No scleral icterus. Cardiovascular:     Rate and Rhythm: Normal rate and regular rhythm.  Pulmonary:     Effort: Pulmonary effort is normal.     Breath sounds: Normal breath sounds.  Musculoskeletal:     Cervical back: Neck supple.  Neurological:     Mental Status: He is alert.  Psychiatric:        Mood and Affect: Mood normal.        Behavior: Behavior normal.     ------------------------------------------------------------------------------------------------------------------------------------------------------------------------------------------------------------------- Assessment and Plan  Essential hypertension BP remains well controlled with lisinopril at current strength. Recommend continuation.    Hyperlipidemia associated with type 2 diabetes mellitus (HCC) Tolerating atorvastatin at current strength.  Update lipid panel.   Type 2 diabetes mellitus without complication, with long-term current use of insulin ([place]) Diabetes is not well controlled.  Continue titration of basaglar.  Split to 30 units twice per day.  F/u in 4 months.   Closed wedge compression fracture of first lumbar vertebra (HCC) Checking vitamin d levels.  Androgen deprivation for  his prostate cancer may have contributed as well.   Meds ordered this encounter  Medications   DISCONTD: Insulin Glargine (BASAGLAR KWIKPEN) 100 UNIT/ML    Sig: Inject 30 Units into the skin 2 (two) times daily.    Dispense:  54 mL    Refill:  1    Requesting 1 year supply   Insulin Glargine (BASAGLAR KWIKPEN) 100 UNIT/ML    Sig: Inject 30 Units into the skin 2 (two) times daily.    Dispense:  54 mL    Refill:  1    Requesting 1 year supply    Return in about 4 months (around 01/27/2023) for F/u T2DM.    This visit occurred during the SARS-CoV-2 public health emergency.  Safety protocols were in place, including screening questions prior  to the visit, additional usage of staff PPE, and extensive cleaning of exam room while observing appropriate contact time as indicated for disinfecting solutions.

## 2022-09-27 NOTE — Telephone Encounter (Signed)
Pt dropped off paperwork to be signed. Paperwork is in Dr. Zigmund Daniel box.

## 2022-09-27 NOTE — Assessment & Plan Note (Signed)
Diabetes is not well controlled.  Continue titration of basaglar.  Split to 30 units twice per day.  F/u in 4 months.

## 2022-09-28 LAB — CBC WITH DIFFERENTIAL/PLATELET
Absolute Monocytes: 584 {cells}/uL (ref 200–950)
Basophils Absolute: 51 {cells}/uL (ref 0–200)
Basophils Relative: 0.7 %
Eosinophils Absolute: 292 {cells}/uL (ref 15–500)
Eosinophils Relative: 4 %
HCT: 40.7 % (ref 38.5–50.0)
Hemoglobin: 14 g/dL (ref 13.2–17.1)
Lymphs Abs: 621 {cells}/uL — ABNORMAL LOW (ref 850–3900)
MCH: 30.5 pg (ref 27.0–33.0)
MCHC: 34.4 g/dL (ref 32.0–36.0)
MCV: 88.7 fL (ref 80.0–100.0)
MPV: 11.8 fL (ref 7.5–12.5)
Monocytes Relative: 8 %
Neutro Abs: 5752 {cells}/uL (ref 1500–7800)
Neutrophils Relative %: 78.8 %
Platelets: 229 10*3/uL (ref 140–400)
RBC: 4.59 Million/uL (ref 4.20–5.80)
RDW: 13.1 % (ref 11.0–15.0)
Total Lymphocyte: 8.5 %
WBC: 7.3 10*3/uL (ref 3.8–10.8)

## 2022-09-28 LAB — LIPID PANEL W/REFLEX DIRECT LDL
Cholesterol: 169 mg/dL (ref <200)
HDL: 34 mg/dL — ABNORMAL LOW (ref >=40)
LDL Cholesterol (Calc): 101 mg/dL (calc) — ABNORMAL HIGH
Non-HDL Cholesterol (Calc): 135 mg/dL (calc) — ABNORMAL HIGH (ref <130)
Total CHOL/HDL Ratio: 5 (calc) — ABNORMAL HIGH (ref <5.0)
Triglycerides: 226 mg/dL — ABNORMAL HIGH (ref <150)

## 2022-09-28 LAB — COMPLETE METABOLIC PANEL WITH GFR
AG Ratio: 1.5 (calc) (ref 1.0–2.5)
ALT: 24 U/L (ref 9–46)
AST: 17 U/L (ref 10–35)
Albumin: 4.5 g/dL (ref 3.6–5.1)
Alkaline phosphatase (APISO): 83 U/L (ref 35–144)
BUN/Creatinine Ratio: 33 (calc) — ABNORMAL HIGH (ref 6–22)
BUN: 55 mg/dL — ABNORMAL HIGH (ref 7–25)
CO2: 17 mmol/L — ABNORMAL LOW (ref 20–32)
Calcium: 9.4 mg/dL (ref 8.6–10.3)
Chloride: 106 mmol/L (ref 98–110)
Creat: 1.65 mg/dL — ABNORMAL HIGH (ref 0.70–1.35)
Globulin: 3.1 g/dL (calc) (ref 1.9–3.7)
Glucose, Bld: 159 mg/dL — ABNORMAL HIGH (ref 65–99)
Potassium: 5.6 mmol/L — ABNORMAL HIGH (ref 3.5–5.3)
Sodium: 137 mmol/L (ref 135–146)
Total Bilirubin: 0.3 mg/dL (ref 0.2–1.2)
Total Protein: 7.6 g/dL (ref 6.1–8.1)
eGFR: 46 mL/min/{1.73_m2} — ABNORMAL LOW (ref >=60)

## 2022-09-28 LAB — MICROALBUMIN / CREATININE URINE RATIO
Creatinine, Urine: 166 mg/dL (ref 20–320)
Microalb Creat Ratio: 27 mg/g creat (ref <30)
Microalb, Ur: 4.4 mg/dL

## 2022-09-28 LAB — VITAMIN D 25 HYDROXY (VIT D DEFICIENCY, FRACTURES): Vit D, 25-Hydroxy: 12 ng/mL — ABNORMAL LOW (ref 30–100)

## 2022-09-30 NOTE — Telephone Encounter (Signed)
Opened in error

## 2022-10-01 ENCOUNTER — Other Ambulatory Visit: Payer: Self-pay | Admitting: Family Medicine

## 2022-10-01 MED ORDER — VITAMIN D (ERGOCALCIFEROL) 1.25 MG (50000 UNIT) PO CAPS: 50000.0000 [IU] | ORAL_CAPSULE | ORAL | 1 refills | Status: DC

## 2022-10-13 ENCOUNTER — Other Ambulatory Visit: Payer: Self-pay | Admitting: Family Medicine

## 2022-10-18 ENCOUNTER — Other Ambulatory Visit: Payer: Self-pay | Admitting: Family Medicine

## 2022-10-18 ENCOUNTER — Telehealth: Payer: Self-pay | Admitting: Family Medicine

## 2022-10-18 MED ORDER — LANTUS SOLOSTAR 100 UNIT/ML ~~LOC~~ SOPN: 30.0000 [IU] | PEN_INJECTOR | Freq: Two times a day (BID) | SUBCUTANEOUS | 1 refills | Status: DC

## 2022-10-18 NOTE — Telephone Encounter (Signed)
Spoke with Panya.  No correspondence received from his insurance company.  Sending updated rx for Lantus.   Thanks!  CM

## 2022-10-18 NOTE — Telephone Encounter (Signed)
Pt came into the office requesting to have his Insulin Glargine (BASAGLAR KWIKPEN) 100 UNIT/ML [[id number]]  prescription changed to Lantus due to his insurance no longer covering the old one. Pt stated the insurance company has tried to contact the office to make this change.

## 2022-10-19 NOTE — Telephone Encounter (Signed)
Called patient and informed him the updated prescription was sent.

## 2022-11-09 ENCOUNTER — Other Ambulatory Visit: Payer: Self-pay | Admitting: Family Medicine

## 2022-11-09 DIAGNOSIS — I10 Essential (primary) hypertension: Secondary | ICD-10-CM

## 2022-11-24 ENCOUNTER — Other Ambulatory Visit: Payer: Self-pay | Admitting: Family Medicine

## 2023-01-03 DIAGNOSIS — C61 Malignant neoplasm of prostate: Secondary | ICD-10-CM | POA: Diagnosis not present

## 2023-01-10 DIAGNOSIS — C61 Malignant neoplasm of prostate: Secondary | ICD-10-CM | POA: Diagnosis not present

## 2023-01-10 DIAGNOSIS — E349 Endocrine disorder, unspecified: Secondary | ICD-10-CM | POA: Diagnosis not present

## 2023-01-27 ENCOUNTER — Ambulatory Visit (INDEPENDENT_AMBULATORY_CARE_PROVIDER_SITE_OTHER): Payer: BC Managed Care – PPO | Admitting: Family Medicine

## 2023-01-27 ENCOUNTER — Encounter: Payer: Self-pay | Admitting: Family Medicine

## 2023-01-27 ENCOUNTER — Ambulatory Visit (INDEPENDENT_AMBULATORY_CARE_PROVIDER_SITE_OTHER): Payer: BC Managed Care – PPO

## 2023-01-27 VITALS — BP 129/75 | HR 69 | Ht 68.0 in | Wt 228.0 lb

## 2023-01-27 DIAGNOSIS — M4854XA Collapsed vertebra, not elsewhere classified, thoracic region, initial encounter for fracture: Secondary | ICD-10-CM | POA: Diagnosis not present

## 2023-01-27 DIAGNOSIS — Z23 Encounter for immunization: Secondary | ICD-10-CM | POA: Diagnosis not present

## 2023-01-27 DIAGNOSIS — M47814 Spondylosis without myelopathy or radiculopathy, thoracic region: Secondary | ICD-10-CM | POA: Diagnosis not present

## 2023-01-27 DIAGNOSIS — M4856XA Collapsed vertebra, not elsewhere classified, lumbar region, initial encounter for fracture: Secondary | ICD-10-CM | POA: Diagnosis not present

## 2023-01-27 DIAGNOSIS — E119 Type 2 diabetes mellitus without complications: Secondary | ICD-10-CM

## 2023-01-27 DIAGNOSIS — E1169 Type 2 diabetes mellitus with other specified complication: Secondary | ICD-10-CM | POA: Diagnosis not present

## 2023-01-27 DIAGNOSIS — I10 Essential (primary) hypertension: Secondary | ICD-10-CM

## 2023-01-27 DIAGNOSIS — M5416 Radiculopathy, lumbar region: Secondary | ICD-10-CM | POA: Diagnosis not present

## 2023-01-27 DIAGNOSIS — M4726 Other spondylosis with radiculopathy, lumbar region: Secondary | ICD-10-CM | POA: Diagnosis not present

## 2023-01-27 DIAGNOSIS — Z8781 Personal history of (healed) traumatic fracture: Secondary | ICD-10-CM | POA: Diagnosis not present

## 2023-01-27 DIAGNOSIS — M4856XD Collapsed vertebra, not elsewhere classified, lumbar region, subsequent encounter for fracture with routine healing: Secondary | ICD-10-CM | POA: Diagnosis not present

## 2023-01-27 DIAGNOSIS — Z794 Long term (current) use of insulin: Secondary | ICD-10-CM

## 2023-01-27 DIAGNOSIS — M47812 Spondylosis without myelopathy or radiculopathy, cervical region: Secondary | ICD-10-CM | POA: Diagnosis not present

## 2023-01-27 DIAGNOSIS — E785 Hyperlipidemia, unspecified: Secondary | ICD-10-CM

## 2023-01-27 LAB — POCT GLYCOSYLATED HEMOGLOBIN (HGB A1C): HbA1c, POC (controlled diabetic range): 8 % — AB (ref 0.0–7.0)

## 2023-01-27 NOTE — Assessment & Plan Note (Signed)
Xrays of the lumbar spine and thoracic spine ordered as he has history of prior compression fracture.

## 2023-01-27 NOTE — Assessment & Plan Note (Signed)
Diabetes is not well controlled.  Admits that diet is not very good.  He will work on dietary changes and would like work on this before adding or changing medication.

## 2023-01-27 NOTE — Assessment & Plan Note (Signed)
Bp remains well controlled.  Recommend continuation of current medication.

## 2023-01-27 NOTE — Assessment & Plan Note (Signed)
Tolerating atorvastatin well at current strength.  Recommend continuation.

## 2023-01-27 NOTE — Progress Notes (Signed)
Jesse Calhoun - 67 y.o. male MRN [id number]  Date of birth: 1955-12-29  Subjective Chief Complaint  Patient presents with   Diabetes    HPI Jesse Calhoun is a 67 y.o. male here today for follow up visit.   He reports that he is feeling ok..  Continues on lisinopril for management of HTN.  He reports that he is tolerating this well at current strength.  Denies side effects at current strength.   Remains on lantus 30 units BID for management of blood sugars.  He is not very active. Blood sugars at home have been in the 150's.   He admits that his diet is terrible. He has had some intermittent numbness down the L leg.  Feels like a pressure and heaviness at times.  Typically lasts for about 10 minutes.  He has history of thoracic and lumbar compression fractures.  Continues on Atorvastatin for associated HLD.   ROS:  A comprehensive ROS was completed and negative except as noted per HPI  No Known Allergies  Past Medical History:  Diagnosis Date   Cataract    11/2015 and 12/2015   Diabetes mellitus without complication (HCC)    Hypertension    Prostate cancer (HCC) 05/23/2020    Past Surgical History:  Procedure Laterality Date   KNEE SURGERY     age 9   PROSTATE BIOPSY      Social History   Socioeconomic History   Marital status: Unknown    Spouse name: Not on file   Number of children: 0   Years of education: Not on file   Highest education level: Not on file  Occupational History   Not on file  Tobacco Use   Smoking status: Heavy Smoker    Current packs/day: 5.00    Types: Cigars, Cigarettes   Smokeless tobacco: Never   Tobacco comments:    smokes approximately 6-8 cigars per day  Vaping Use   Vaping status: Never Used  Substance and Sexual Activity   Alcohol use: No    Alcohol/week: 0.0 standard drinks of alcohol   Drug use: No   Sexual activity: Not Currently  Other Topics Concern   Not on file  Social History Narrative   Not on file   Social  Determinants of Health   Financial Resource Strain: Low Risk  (07/14/2021)   Overall Financial Resource Strain (CARDIA)    Difficulty of Paying Living Expenses: Not hard at all  Food Insecurity: No Food Insecurity (07/14/2021)   Hunger Vital Sign    Worried About Running Out of Food in the Last Year: Never true    Ran Out of Food in the Last Year: Never true  Transportation Needs: No Transportation Needs (07/14/2021)   PRAPARE - Administrator, Civil Service (Medical): No    Lack of Transportation (Non-Medical): No  Physical Activity: Inactive (07/14/2021)   Exercise Vital Sign    Days of Exercise per Week: 0 days    Minutes of Exercise per Session: 0 min  Stress: No Stress Concern Present (07/14/2021)   [employer] of Occupational Health - Occupational Stress Questionnaire    Feeling of Stress : Not at all  Social Connections: Unknown (12/03/2021)   Received from [facility]   Social Network    Social Network: Not on file    Family History  Problem Relation Age of Onset   Diabetes Mother    Cancer Father        unknown type "possibly lung"  Breast cancer Neg Hx    Prostate cancer Neg Hx    Colon cancer Neg Hx    Pancreatic cancer Neg Hx     Health Maintenance  Topic Date Due   Zoster Vaccines- Shingrix (2 of 2) 11/22/2022   OPHTHALMOLOGY EXAM  03/30/2023 (Originally 02/02/2021)   COVID-19 Vaccine (4 - 2023-24 season) 03/30/2023 (Originally 03/05/2022)   Colonoscopy  03/29/2024 (Originally 07/27/2001)   INFLUENZA VACCINE  02/03/2023   FOOT EXAM  05/25/2023   HEMOGLOBIN A1C  07/30/2023   Diabetic kidney evaluation - eGFR measurement  09/27/2023   Diabetic kidney evaluation - Urine ACR  09/27/2023   DTaP/Tdap/Td (2 - Td or Tdap) 06/02/2025   Pneumonia Vaccine 45+ Years old  Completed   Hepatitis C Screening  Completed   HPV VACCINES  Aged Out      ----------------------------------------------------------------------------------------------------------------------------------------------------------------------------------------------------------------- Physical Exam BP 129/75 (BP Location: Left Arm, Patient Position: Sitting, Cuff Size: Large)   Pulse 69   Ht 5\' 8"  (1.727 m)   Wt 228 lb (103.4 kg)   SpO2 98%   BMI 34.67 kg/m   Physical Exam Constitutional:      Appearance: Normal appearance.  Eyes:     General: No scleral icterus. Cardiovascular:     Rate and Rhythm: Normal rate and regular rhythm.  Pulmonary:     Effort: Pulmonary effort is normal.     Breath sounds: Normal breath sounds.  Neurological:     Mental Status: He is alert.  Psychiatric:        Mood and Affect: Mood normal.        Behavior: Behavior normal.     ------------------------------------------------------------------------------------------------------------------------------------------------------------------------------------------------------------------- Assessment and Plan  Hyperlipidemia associated with type 2 diabetes mellitus (HCC) Tolerating atorvastatin well at current strength.  Recommend continuation.    Type 2 diabetes mellitus without complication, with long-term current use of insulin (HCC) Diabetes is not well controlled.  Admits that diet is not very good.  He will work on dietary changes and would like work on this before adding or changing medication.    Essential hypertension Bp remains well controlled.  Recommend continuation of current medication.   Lumbar radicular pain Xrays of the lumbar spine and thoracic spine ordered as he has history of prior compression fracture.     No orders of the defined types were placed in this encounter.   Return in about 4 months (around 05/30/2023) for T2DM.    This visit occurred during the SARS-CoV-2 public health emergency.  Safety protocols were in place, including  screening questions prior to the visit, additional usage of staff PPE, and extensive cleaning of exam room while observing appropriate contact time as indicated for disinfecting solutions.

## 2023-02-11 ENCOUNTER — Other Ambulatory Visit: Payer: Self-pay

## 2023-02-11 DIAGNOSIS — E119 Type 2 diabetes mellitus without complications: Secondary | ICD-10-CM

## 2023-02-11 MED ORDER — BD PEN NEEDLE SHORT U/F 31G X 8 MM MISC
3 refills | Status: DC
Start: 2023-02-11 — End: 2023-03-23

## 2023-03-23 ENCOUNTER — Other Ambulatory Visit: Payer: Self-pay

## 2023-03-23 DIAGNOSIS — E119 Type 2 diabetes mellitus without complications: Secondary | ICD-10-CM

## 2023-03-23 MED ORDER — BD PEN NEEDLE SHORT U/F 31G X 8 MM MISC
3 refills | Status: DC
Start: 2023-03-23 — End: 2023-04-14

## 2023-04-05 ENCOUNTER — Telehealth: Payer: Self-pay | Admitting: Family Medicine

## 2023-04-05 NOTE — Telephone Encounter (Signed)
Patient called in stating that insurance will not supple him with enough insulin pin needles because the rx has not been updated in the system.

## 2023-04-13 NOTE — Telephone Encounter (Signed)
Please advise patient that new Rx was sent to pharmacy on 03/23/23. Thanks

## 2023-04-14 ENCOUNTER — Other Ambulatory Visit: Payer: Self-pay | Admitting: Family Medicine

## 2023-04-14 DIAGNOSIS — Z794 Long term (current) use of insulin: Secondary | ICD-10-CM

## 2023-04-14 MED ORDER — BD PEN NEEDLE SHORT U/F 31G X 8 MM MISC
3 refills | Status: DC
Start: 2023-04-14 — End: 2024-04-18

## 2023-04-14 NOTE — Telephone Encounter (Signed)
Updated rx sent to reflect BID dosing.

## 2023-05-12 ENCOUNTER — Other Ambulatory Visit: Payer: Self-pay | Admitting: Family Medicine

## 2023-05-30 ENCOUNTER — Ambulatory Visit (INDEPENDENT_AMBULATORY_CARE_PROVIDER_SITE_OTHER): Payer: BC Managed Care – PPO | Admitting: Family Medicine

## 2023-05-30 ENCOUNTER — Encounter: Payer: Self-pay | Admitting: Family Medicine

## 2023-05-30 ENCOUNTER — Ambulatory Visit: Payer: BC Managed Care – PPO

## 2023-05-30 VITALS — BP 134/70 | HR 76 | Ht 68.0 in | Wt 230.0 lb

## 2023-05-30 DIAGNOSIS — Z794 Long term (current) use of insulin: Secondary | ICD-10-CM | POA: Diagnosis not present

## 2023-05-30 DIAGNOSIS — E785 Hyperlipidemia, unspecified: Secondary | ICD-10-CM

## 2023-05-30 DIAGNOSIS — I10 Essential (primary) hypertension: Secondary | ICD-10-CM

## 2023-05-30 DIAGNOSIS — Z23 Encounter for immunization: Secondary | ICD-10-CM

## 2023-05-30 DIAGNOSIS — E1169 Type 2 diabetes mellitus with other specified complication: Secondary | ICD-10-CM

## 2023-05-30 DIAGNOSIS — F17290 Nicotine dependence, other tobacco product, uncomplicated: Secondary | ICD-10-CM | POA: Diagnosis not present

## 2023-05-30 DIAGNOSIS — R053 Chronic cough: Secondary | ICD-10-CM

## 2023-05-30 DIAGNOSIS — F17200 Nicotine dependence, unspecified, uncomplicated: Secondary | ICD-10-CM | POA: Diagnosis not present

## 2023-05-30 DIAGNOSIS — E119 Type 2 diabetes mellitus without complications: Secondary | ICD-10-CM | POA: Diagnosis not present

## 2023-05-30 DIAGNOSIS — R9389 Abnormal findings on diagnostic imaging of other specified body structures: Secondary | ICD-10-CM | POA: Diagnosis not present

## 2023-05-30 LAB — POCT GLYCOSYLATED HEMOGLOBIN (HGB A1C): HbA1c, POC (controlled diabetic range): 7.9 % — AB (ref 0.0–7.0)

## 2023-05-30 MED ORDER — LANTUS SOLOSTAR 100 UNIT/ML ~~LOC~~ SOPN: 40.0000 [IU] | PEN_INJECTOR | Freq: Two times a day (BID) | SUBCUTANEOUS | 3 refills | Status: DC

## 2023-05-30 NOTE — Assessment & Plan Note (Signed)
Bp remains well controlled.  Recommend continuation of lisinopril 20mg  daily.

## 2023-05-30 NOTE — Patient Instructions (Addendum)
Increase Lantus to 40 units twice per day.  See me again in 6 months.

## 2023-05-30 NOTE — Assessment & Plan Note (Signed)
Tolerating atorvastatin well at current strength.  Recommend continuation.

## 2023-05-30 NOTE — Progress Notes (Signed)
Jesse Calhoun - 67 y.o. male MRN [id number]  Date of birth: March 13, 1956  Subjective Chief Complaint  Patient presents with   Hypertension   Diabetes    HPI Jesse Calhoun is a 67 y.o. male here today for follow up visit.   Reports that he is doing well overall.  He continues to have back pain. He has had cough for a few months as well.  He does smoke 4 cigars per day for the past 40 years.   Remains on lantus 34 units BID.  Tolerating well without hypoglycemia.  A1c today is 7.9%.  Activity level remains pretty low.  His diet has not really changed.   BP remains well controlled with 20mg  of lisinopril.   ROS:  A comprehensive ROS was completed and negative except as noted per HPI  No Known Allergies  Past Medical History:  Diagnosis Date   Cataract    11/2015 and 12/2015   Diabetes mellitus without complication (HCC)    Hypertension    Prostate cancer (HCC) 05/23/2020    Past Surgical History:  Procedure Laterality Date   KNEE SURGERY     age 66   PROSTATE BIOPSY      Social History   Socioeconomic History   Marital status: Unknown    Spouse name: Not on file   Number of children: 0   Years of education: Not on file   Highest education level: Not on file  Occupational History   Not on file  Tobacco Use   Smoking status: Heavy Smoker    Current packs/day: 5.00    Types: Cigars, Cigarettes   Smokeless tobacco: Never   Tobacco comments:    smokes approximately 6-8 cigars per day  Vaping Use   Vaping status: Never Used  Substance and Sexual Activity   Alcohol use: No    Alcohol/week: 0.0 standard drinks of alcohol   Drug use: No   Sexual activity: Not Currently  Other Topics Concern   Not on file  Social History Narrative   Not on file   Social Determinants of Health   Financial Resource Strain: Low Risk  (07/14/2021)   Overall Financial Resource Strain (CARDIA)    Difficulty of Paying Living Expenses: Not hard at all  Food Insecurity: No Food Insecurity  (07/14/2021)   Hunger Vital Sign    Worried About Running Out of Food in the Last Year: Never true    Ran Out of Food in the Last Year: Never true  Transportation Needs: No Transportation Needs (07/14/2021)   PRAPARE - Administrator, Civil Service (Medical): No    Lack of Transportation (Non-Medical): No  Physical Activity: Inactive (07/14/2021)   Exercise Vital Sign    Days of Exercise per Week: 0 days    Minutes of Exercise per Session: 0 min  Stress: No Stress Concern Present (07/14/2021)   [employer] of Occupational Health - Occupational Stress Questionnaire    Feeling of Stress : Not at all  Social Connections: Unknown (12/03/2021)   Received from [facility] [place]   Social Network    Social Network: Not on file    Family History  Problem Relation Age of Onset   Diabetes Mother    Cancer Father        unknown type "possibly lung"   Breast cancer Neg Hx    Prostate cancer Neg Hx    Colon cancer Neg Hx    Pancreatic cancer Neg Hx  Health Maintenance  Topic Date Due   OPHTHALMOLOGY EXAM  02/02/2021   INFLUENZA VACCINE  02/03/2023   COVID-19 Vaccine (4 - 2023-24 season) 09/03/2023 (Originally 03/06/2023)   Colonoscopy  03/29/2024 (Originally 07/27/2001)   HEMOGLOBIN A1C  07/30/2023   Diabetic kidney evaluation - eGFR measurement  09/27/2023   Diabetic kidney evaluation - Urine ACR  09/27/2023   FOOT EXAM  05/29/2024   DTaP/Tdap/Td (2 - Td or Tdap) 06/02/2025   Pneumonia Vaccine 76+ Years old  Completed   Hepatitis C Screening  Completed   Zoster Vaccines- Shingrix  Completed   HPV VACCINES  Aged Out     ----------------------------------------------------------------------------------------------------------------------------------------------------------------------------------------------------------------- Physical Exam BP 134/70 (BP Location: Left Arm, Patient Position: Sitting, Cuff Size: Large)   Pulse 76   Ht 5\' 8"  (1.727 m)   Wt  230 lb (104.3 kg)   SpO2 97%   BMI 34.97 kg/m   Physical Exam Constitutional:      Appearance: Normal appearance.  HENT:     Head: Normocephalic and atraumatic.  Cardiovascular:     Rate and Rhythm: Normal rate and regular rhythm.  Pulmonary:     Effort: Pulmonary effort is normal.     Breath sounds: Normal breath sounds.  Musculoskeletal:     Cervical back: Neck supple.  Neurological:     General: No focal deficit present.     Mental Status: He is alert.  Psychiatric:        Mood and Affect: Mood normal.        Behavior: Behavior normal.     ------------------------------------------------------------------------------------------------------------------------------------------------------------------------------------------------------------------- Assessment and Plan  Hyperlipidemia associated with type 2 diabetes mellitus (HCC) Tolerating atorvastatin well at current strength.  Recommend continuation.    Essential hypertension Bp remains well controlled.  Recommend continuation of lisinopril 20mg  daily.   Type 2 diabetes mellitus without complication, with long-term current use of insulin (HCC) Diabetes is not well controlled.  Admits that diet is not very good.  Encouraged to continue working on dietary changes. Increase insulin lantus to 40 units BID.    Chronic cough CXR ordered.    Meds ordered this encounter  Medications   insulin glargine (LANTUS SOLOSTAR) 100 UNIT/ML Solostar Pen    Sig: Inject 40 Units into the skin 2 (two) times daily.    Dispense:  72 mL    Refill:  3    Please send a replace/new response with 90-Day Supply if appropriate to maximize member benefit. Requesting 1 year supply.    Return in about 6 months (around 11/27/2023).    This visit occurred during the SARS-CoV-2 public health emergency.  Safety protocols were in place, including screening questions prior to the visit, additional usage of staff PPE, and extensive cleaning of  exam room while observing appropriate contact time as indicated for disinfecting solutions.

## 2023-05-30 NOTE — Assessment & Plan Note (Signed)
CXR ordered 

## 2023-05-30 NOTE — Addendum Note (Signed)
Addended by: Ardyth Man on: 05/30/2023 09:29 AM   Modules accepted: Orders

## 2023-05-30 NOTE — Assessment & Plan Note (Signed)
Diabetes is not well controlled.  Admits that diet is not very good.  Encouraged to continue working on dietary changes. Increase insulin lantus to 40 units BID.

## 2023-07-21 DIAGNOSIS — C61 Malignant neoplasm of prostate: Secondary | ICD-10-CM | POA: Diagnosis not present

## 2023-07-21 DIAGNOSIS — E349 Endocrine disorder, unspecified: Secondary | ICD-10-CM | POA: Diagnosis not present

## 2023-07-28 DIAGNOSIS — Z8546 Personal history of malignant neoplasm of prostate: Secondary | ICD-10-CM | POA: Diagnosis not present

## 2023-07-28 DIAGNOSIS — E349 Endocrine disorder, unspecified: Secondary | ICD-10-CM | POA: Diagnosis not present

## 2023-08-29 DIAGNOSIS — E349 Endocrine disorder, unspecified: Secondary | ICD-10-CM | POA: Diagnosis not present

## 2023-09-14 ENCOUNTER — Other Ambulatory Visit: Payer: Self-pay | Admitting: Family Medicine

## 2023-09-14 DIAGNOSIS — I10 Essential (primary) hypertension: Secondary | ICD-10-CM

## 2023-09-25 ENCOUNTER — Other Ambulatory Visit: Payer: Self-pay | Admitting: Family Medicine

## 2023-11-07 DIAGNOSIS — C61 Malignant neoplasm of prostate: Secondary | ICD-10-CM | POA: Diagnosis not present

## 2023-11-07 DIAGNOSIS — E349 Endocrine disorder, unspecified: Secondary | ICD-10-CM | POA: Diagnosis not present

## 2023-11-21 ENCOUNTER — Ambulatory Visit: Payer: BC Managed Care – PPO | Admitting: Family Medicine

## 2023-11-29 ENCOUNTER — Ambulatory Visit (INDEPENDENT_AMBULATORY_CARE_PROVIDER_SITE_OTHER): Admitting: Family Medicine

## 2023-11-29 ENCOUNTER — Encounter: Payer: Self-pay | Admitting: Family Medicine

## 2023-11-29 VITALS — BP 116/68 | HR 77 | Ht 68.0 in | Wt 236.0 lb

## 2023-11-29 DIAGNOSIS — Z1211 Encounter for screening for malignant neoplasm of colon: Secondary | ICD-10-CM | POA: Diagnosis not present

## 2023-11-29 DIAGNOSIS — E1169 Type 2 diabetes mellitus with other specified complication: Secondary | ICD-10-CM | POA: Diagnosis not present

## 2023-11-29 DIAGNOSIS — Z794 Long term (current) use of insulin: Secondary | ICD-10-CM | POA: Diagnosis not present

## 2023-11-29 DIAGNOSIS — E119 Type 2 diabetes mellitus without complications: Secondary | ICD-10-CM

## 2023-11-29 DIAGNOSIS — I10 Essential (primary) hypertension: Secondary | ICD-10-CM

## 2023-11-29 DIAGNOSIS — E785 Hyperlipidemia, unspecified: Secondary | ICD-10-CM | POA: Diagnosis not present

## 2023-11-29 LAB — POCT GLYCOSYLATED HEMOGLOBIN (HGB A1C): HbA1c, POC (controlled diabetic range): 9.3 % — AB (ref 0.0–7.0)

## 2023-11-29 MED ORDER — MUPIROCIN 2 % EX OINT: TOPICAL_OINTMENT | CUTANEOUS | 0 refills | Status: AC

## 2023-11-29 NOTE — Assessment & Plan Note (Signed)
 Bp remains well controlled.  Recommend continuation of lisinopril 20mg  daily.

## 2023-11-29 NOTE — Assessment & Plan Note (Signed)
 Blood sugars have worsened.  Declines escalation in medical therapy and would prefer to work on dietary changes.  Encouraged to work on getting set up with new PCP in [place] to follow up in 3-4 months.

## 2023-11-29 NOTE — Assessment & Plan Note (Signed)
 Tolerating atorvastatin well at current strength.  Recommend continuation.

## 2023-11-29 NOTE — Progress Notes (Signed)
 Jesse Calhoun - 68 y.o. male MRN [id number]  Date of birth: 29-Oct-1955  Subjective Chief Complaint  Patient presents with   Diabetes   Hypertension   Abrasion    HPI Jesse Calhoun is a 68 y.o. male here today for follow up visit.   He reports that he is feeling pretty well.  He recently retired and will be living in [place] full time.     He continues on lantus  for management of diabetes.  A1c today is 9.35. He denies symptoms of hypoglycemia.  Remains on atorvastatin  for management of HLD.   BP is well controlled at this time.  Continues on 1/2 tab of  lisinopril  40mg  daily. He denies chest pain, shortness of breath, palpitations, headache or vision changes.   Continues to see urology for history of prostate cancer.   ROS:  A comprehensive ROS was completed and negative except as noted per HPI  No Known Allergies  Past Medical History:  Diagnosis Date   Cataract    11/2015 and 12/2015   Diabetes mellitus without complication (HCC)    Hypertension    Prostate cancer (HCC) 05/23/2020    Past Surgical History:  Procedure Laterality Date   KNEE SURGERY     age 82   PROSTATE BIOPSY      Social History   Socioeconomic History   Marital status: Unknown    Spouse name: Not on file   Number of children: 0   Years of education: Not on file   Highest education level: Not on file  Occupational History   Not on file  Tobacco Use   Smoking status: Heavy Smoker    Current packs/day: 5.00    Types: Cigars, Cigarettes   Smokeless tobacco: Never   Tobacco comments:    smokes approximately 6-8 cigars per day  Vaping Use   Vaping status: Never Used  Substance and Sexual Activity   Alcohol use: No    Alcohol/week: 0.0 standard drinks of alcohol   Drug use: No   Sexual activity: Not Currently  Other Topics Concern   Not on file  Social History Narrative   Not on file   Social Drivers of Health   Financial Resource Strain: Low Risk  (07/14/2021)   Overall Financial  Resource Strain (CARDIA)    Difficulty of Paying Living Expenses: Not hard at all  Food Insecurity: No Food Insecurity (07/14/2021)   Hunger Vital Sign    Worried About Running Out of Food in the Last Year: Never true    Ran Out of Food in the Last Year: Never true  Transportation Needs: No Transportation Needs (07/14/2021)   PRAPARE - Administrator, Civil Service (Medical): No    Lack of Transportation (Non-Medical): No  Physical Activity: Inactive (07/14/2021)   Exercise Vital Sign    Days of Exercise per Week: 0 days    Minutes of Exercise per Session: 0 min  Stress: No Stress Concern Present (07/14/2021)   [employer] of Occupational Health - Occupational Stress Questionnaire    Feeling of Stress : Not at all  Social Connections: Unknown (12/03/2021)   Received from [facility], [facility]   Social Network    Social Network: Not on file    Family History  Problem Relation Age of Onset   Diabetes Mother    Cancer Father        unknown type "possibly lung"   Breast cancer Neg Hx    Prostate cancer Neg Hx  Colon cancer Neg Hx    Pancreatic cancer Neg Hx     Health Maintenance  Topic Date Due   OPHTHALMOLOGY EXAM  02/02/2021   COVID-19 Vaccine (4 - 2024-25 season) 03/06/2023   Diabetic kidney evaluation - eGFR measurement  09/27/2023   Diabetic kidney evaluation - Urine ACR  09/27/2023   HEMOGLOBIN A1C  11/27/2023   Colonoscopy  03/29/2024 (Originally 07/27/2001)   INFLUENZA VACCINE  02/03/2024   FOOT EXAM  05/29/2024   DTaP/Tdap/Td (2 - Td or Tdap) 06/02/2025   Pneumonia Vaccine 61+ Years old  Completed   Hepatitis C Screening  Completed   Zoster Vaccines- Shingrix   Completed   HPV VACCINES  Aged Out   Meningococcal B Vaccine  Aged Out      ----------------------------------------------------------------------------------------------------------------------------------------------------------------------------------------------------------------- Physical Exam BP 116/68 (BP Location: Left Arm, Patient Position: Sitting, Cuff Size: Large)   Pulse 77   Ht 5\' 8"  (1.727 m)   Wt 236 lb (107 kg)   SpO2 97%   BMI 35.88 kg/m   Physical Exam Constitutional:      Appearance: Normal appearance.  Eyes:     General: No scleral icterus. Cardiovascular:     Rate and Rhythm: Normal rate and regular rhythm.  Pulmonary:     Effort: Pulmonary effort is normal.     Breath sounds: Normal breath sounds.  Musculoskeletal:     Cervical back: Neck supple.  Neurological:     Mental Status: He is alert.  Psychiatric:        Mood and Affect: Mood normal.        Behavior: Behavior normal.     ------------------------------------------------------------------------------------------------------------------------------------------------------------------------------------------------------------------- Assessment and Plan  Hyperlipidemia associated with type 2 diabetes mellitus (HCC) Tolerating atorvastatin  well at current strength.  Recommend continuation.    Essential hypertension Bp remains well controlled.  Recommend continuation of lisinopril  20mg  daily.   Type 2 diabetes mellitus without complication, with long-term current use of insulin  (HCC) Blood sugars have worsened.  Declines escalation in medical therapy and would prefer to work on dietary changes.  Encouraged to work on getting set up with new PCP in [place] to follow up in 3-4 months.    No orders of the defined types were placed in this encounter.   No follow-ups on file.

## 2023-11-30 LAB — CMP14+EGFR
ALT: 19 IU/L (ref 0–44)
AST: 16 IU/L (ref 0–40)
Albumin: 4.1 g/dL (ref 3.9–4.9)
Alkaline Phosphatase: 64 IU/L (ref 44–121)
BUN/Creatinine Ratio: 22 (ref 10–24)
BUN: 35 mg/dL — ABNORMAL HIGH (ref 8–27)
Bilirubin Total: 0.2 mg/dL (ref 0.0–1.2)
CO2: 18 mmol/L — ABNORMAL LOW (ref 20–29)
Calcium: 9.4 mg/dL (ref 8.6–10.2)
Chloride: 108 mmol/L — ABNORMAL HIGH (ref 96–106)
Creatinine, Ser: 1.6 mg/dL — ABNORMAL HIGH (ref 0.76–1.27)
Globulin, Total: 2.5 g/dL (ref 1.5–4.5)
Glucose: 132 mg/dL — ABNORMAL HIGH (ref 70–99)
Potassium: 5.2 mmol/L (ref 3.5–5.2)
Sodium: 141 mmol/L (ref 134–144)
Total Protein: 6.6 g/dL (ref 6.0–8.5)
eGFR: 47 mL/min/{1.73_m2} — ABNORMAL LOW (ref >59)

## 2023-11-30 LAB — LIPID PANEL WITH LDL/HDL RATIO
Cholesterol, Total: 160 mg/dL (ref 100–199)
HDL: 32 mg/dL — ABNORMAL LOW (ref >39)
LDL Chol Calc (NIH): 93 mg/dL (ref 0–99)
LDL/HDL Ratio: 2.9 ratio (ref 0.0–3.6)
Triglycerides: 202 mg/dL — ABNORMAL HIGH (ref 0–149)
VLDL Cholesterol Cal: 35 mg/dL (ref 5–40)

## 2023-11-30 LAB — CBC WITH DIFFERENTIAL/PLATELET
Basophils Absolute: 0.1 10*3/uL (ref 0.0–0.2)
Basos: 1 %
EOS (ABSOLUTE): 0.4 10*3/uL (ref 0.0–0.4)
Eos: 5 %
Hematocrit: 40.9 % (ref 37.5–51.0)
Hemoglobin: 13.8 g/dL (ref 13.0–17.7)
Immature Grans (Abs): 0 10*3/uL (ref 0.0–0.1)
Immature Granulocytes: 0 %
Lymphocytes Absolute: 0.7 10*3/uL (ref 0.7–3.1)
Lymphs: 8 %
MCH: 30.7 pg (ref 26.6–33.0)
MCHC: 33.7 g/dL (ref 31.5–35.7)
MCV: 91 fL (ref 79–97)
Monocytes Absolute: 0.6 10*3/uL (ref 0.1–0.9)
Monocytes: 8 %
Neutrophils Absolute: 6.5 10*3/uL (ref 1.4–7.0)
Neutrophils: 78 %
Platelets: 190 10*3/uL (ref 150–450)
RBC: 4.5 x10E6/uL (ref 4.14–5.80)
RDW: 13.6 % (ref 11.6–15.4)
WBC: 8.3 10*3/uL (ref 3.4–10.8)

## 2023-11-30 LAB — MICROALBUMIN / CREATININE URINE RATIO
Creatinine, Urine: 134.4 mg/dL
Microalb/Creat Ratio: 12 mg/g{creat} (ref 0–29)
Microalbumin, Urine: 15.8 ug/mL

## 2023-12-02 ENCOUNTER — Ambulatory Visit: Payer: Self-pay | Admitting: Family Medicine

## 2023-12-12 DIAGNOSIS — R972 Elevated prostate specific antigen [PSA]: Secondary | ICD-10-CM | POA: Diagnosis not present

## 2023-12-12 DIAGNOSIS — E349 Endocrine disorder, unspecified: Secondary | ICD-10-CM | POA: Diagnosis not present

## 2023-12-12 DIAGNOSIS — C61 Malignant neoplasm of prostate: Secondary | ICD-10-CM | POA: Diagnosis not present

## 2024-01-16 DIAGNOSIS — E119 Type 2 diabetes mellitus without complications: Secondary | ICD-10-CM | POA: Diagnosis not present

## 2024-01-16 DIAGNOSIS — K5730 Diverticulosis of large intestine without perforation or abscess without bleeding: Secondary | ICD-10-CM | POA: Insufficient documentation

## 2024-01-16 DIAGNOSIS — D121 Benign neoplasm of appendix: Secondary | ICD-10-CM | POA: Diagnosis not present

## 2024-01-16 DIAGNOSIS — Z1211 Encounter for screening for malignant neoplasm of colon: Secondary | ICD-10-CM | POA: Diagnosis not present

## 2024-01-16 DIAGNOSIS — K6389 Other specified diseases of intestine: Secondary | ICD-10-CM | POA: Diagnosis not present

## 2024-01-16 DIAGNOSIS — I10 Essential (primary) hypertension: Secondary | ICD-10-CM | POA: Diagnosis not present

## 2024-01-16 DIAGNOSIS — D123 Benign neoplasm of transverse colon: Secondary | ICD-10-CM | POA: Diagnosis not present

## 2024-01-16 DIAGNOSIS — Z794 Long term (current) use of insulin: Secondary | ICD-10-CM | POA: Diagnosis not present

## 2024-02-02 ENCOUNTER — Other Ambulatory Visit: Payer: Self-pay | Admitting: Family Medicine

## 2024-02-02 DIAGNOSIS — I10 Essential (primary) hypertension: Secondary | ICD-10-CM

## 2024-03-05 ENCOUNTER — Other Ambulatory Visit: Payer: Self-pay | Admitting: Family Medicine

## 2024-04-18 ENCOUNTER — Ambulatory Visit: Admitting: Family Medicine

## 2024-04-18 ENCOUNTER — Encounter: Payer: Self-pay | Admitting: Family Medicine

## 2024-04-18 VITALS — BP 135/72 | HR 76 | Ht 68.0 in | Wt 233.0 lb

## 2024-04-18 DIAGNOSIS — Z23 Encounter for immunization: Secondary | ICD-10-CM | POA: Diagnosis not present

## 2024-04-18 DIAGNOSIS — L819 Disorder of pigmentation, unspecified: Secondary | ICD-10-CM | POA: Insufficient documentation

## 2024-04-18 DIAGNOSIS — E119 Type 2 diabetes mellitus without complications: Secondary | ICD-10-CM | POA: Diagnosis not present

## 2024-04-18 DIAGNOSIS — Z794 Long term (current) use of insulin: Secondary | ICD-10-CM

## 2024-04-18 LAB — POCT GLYCOSYLATED HEMOGLOBIN (HGB A1C): HbA1c, POC (controlled diabetic range): 8.2 % — AB (ref 0.0–7.0)

## 2024-04-18 MED ORDER — LANTUS SOLOSTAR 100 UNIT/ML ~~LOC~~ SOPN: 45.0000 [IU] | PEN_INJECTOR | Freq: Two times a day (BID) | SUBCUTANEOUS | 1 refills | Status: DC

## 2024-04-18 MED ORDER — INSULIN PEN NEEDLE 32G X 8 MM MISC: 2 refills | Status: DC

## 2024-04-18 NOTE — Assessment & Plan Note (Signed)
 Concern for possible melanoma.  Referral placed to dermatology.  He prefers that this be in [place].

## 2024-04-18 NOTE — Progress Notes (Signed)
 Jesse Calhoun - 68 y.o. male MRN [id number]  Date of birth: August 22, 1955  Subjective Chief Complaint  Patient presents with   Diabetes   Foot Injury    HPI Jesse Calhoun is a 68 y.o. male here today with complaint of pigmented lesion on great toe of R foot.  He first noticed this about 1 week ago.  Denies pain associated with this.  No prior history of skin cancer.    A1c is a little better at 8.2%.  He has tried Ozempic  and did not tolerate this well.  Stopped metformin  due to GI upset.  He would prefer to remain just on insulin .   ROS:  A comprehensive ROS was completed and negative except as noted per HPI   No Known Allergies  Past Medical History:  Diagnosis Date   Cataract    11/2015 and 12/2015   Diabetes mellitus without complication (HCC)    Hypertension    Prostate cancer (HCC) 05/23/2020    Past Surgical History:  Procedure Laterality Date   KNEE SURGERY     age 53   PROSTATE BIOPSY      Social History   Socioeconomic History   Marital status: Unknown    Spouse name: Not on file   Number of children: 0   Years of education: Not on file   Highest education level: Not on file  Occupational History   Not on file  Tobacco Use   Smoking status: Heavy Smoker    Current packs/day: 5.00    Types: Cigars, Cigarettes   Smokeless tobacco: Never   Tobacco comments:    smokes approximately 6-8 cigars per day  Vaping Use   Vaping status: Never Used  Substance and Sexual Activity   Alcohol use: No    Alcohol/week: 0.0 standard drinks of alcohol   Drug use: No   Sexual activity: Not Currently  Other Topics Concern   Not on file  Social History Narrative   Not on file   Social Drivers of Health   Financial Resource Strain: Low Risk  (04/18/2024)   Overall Financial Resource Strain (CARDIA)    Difficulty of Paying Living Expenses: Not hard at all  Food Insecurity: No Food Insecurity (04/18/2024)   Hunger Vital Sign    Worried About Running Out of Food in the  Last Year: Never true    Ran Out of Food in the Last Year: Never true  Transportation Needs: No Transportation Needs (04/18/2024)   PRAPARE - Administrator, Civil Service (Medical): No    Lack of Transportation (Non-Medical): No  Physical Activity: Inactive (04/18/2024)   Exercise Vital Sign    Days of Exercise per Week: 0 days    Minutes of Exercise per Session: 0 min  Stress: No Stress Concern Present (04/18/2024)   [employer] of Occupational Health - Occupational Stress Questionnaire    Feeling of Stress: Not at all  Social Connections: Moderately Isolated (04/18/2024)   Social Connection and Isolation Panel    Frequency of Communication with Friends and Family: Three times a week    Frequency of Social Gatherings with Friends and Family: Twice a week    Attends Religious Services: Never    Database administrator or Organizations: No    Attends Banker Meetings: Never    Marital Status: Married    Family History  Problem Relation Age of Onset   Diabetes Mother    Cancer Father  unknown type possibly lung   Breast cancer Neg Hx    Prostate cancer Neg Hx    Colon cancer Neg Hx    Pancreatic cancer Neg Hx     Health Maintenance  Topic Date Due   OPHTHALMOLOGY EXAM  02/02/2021   COVID-19 Vaccine (4 - 2025-26 season) 05/04/2024 (Originally 03/05/2024)   HEMOGLOBIN A1C  10/17/2024   Diabetic kidney evaluation - eGFR measurement  11/28/2024   Diabetic kidney evaluation - Urine ACR  11/28/2024   FOOT EXAM  04/18/2025   DTaP/Tdap/Td (2 - Td or Tdap) 06/02/2025   Colonoscopy  01/15/2034   Pneumococcal Vaccine: 50+ Years  Completed   Influenza Vaccine  Completed   Hepatitis C Screening  Completed   Zoster Vaccines- Shingrix   Completed   Meningococcal B Vaccine  Aged Out      ----------------------------------------------------------------------------------------------------------------------------------------------------------------------------------------------------------------- Physical Exam BP (!) 141/74 (BP Location: Left Arm, Patient Position: Sitting, Cuff Size: Large)   Pulse 76   Ht 5' 8 (1.727 m)   Wt 233 lb (105.7 kg)   SpO2 97%   BMI 35.43 kg/m   Physical Exam Constitutional:      Appearance: Normal appearance.  Eyes:     General: No scleral icterus. Musculoskeletal:     Cervical back: Neck supple.  Skin:    Comments: Pigmented lesion on bottom of R toe.  Non-tender.  Lesion has irregular borders.   Neurological:     Mental Status: He is alert.     ------------------------------------------------------------------------------------------------------------------------------------------------------------------------------------------------------------------- Assessment and Plan  Type 2 diabetes mellitus without complication, with long-term current use of insulin  (HCC) A1c improved some but remains elevated.  Increase lantus  to 45 units.  May need to add mealtime insulin  if A1c remains elevated.   Pigmented skin lesion of lower extremity Concern for possible melanoma.  Referral placed to dermatology.  He prefers that this be in [place].     Meds ordered this encounter  Medications   insulin  glargine (LANTUS  SOLOSTAR) 100 UNIT/ML Solostar Pen    Sig: Inject 45 Units into the skin 2 (two) times daily.    Dispense:  81 mL    Refill:  1    Please send a replace/new response with 90-Day Supply if appropriate to maximize member benefit. Requesting 1 year supply.   Insulin  Pen Needle 32G X 8 MM MISC    Sig: Use to inject insulin  twice per day    Dispense:  100 each    Refill:  2    Return in about 4 months (around 08/19/2024) for Type 2 Diabetes.

## 2024-04-18 NOTE — Assessment & Plan Note (Signed)
 A1c improved some but remains elevated.  Increase lantus  to 45 units.  May need to add mealtime insulin  if A1c remains elevated.

## 2024-04-18 NOTE — Progress Notes (Signed)
 Patient states he is working on scheduling Diabetic Eye Exam.

## 2024-05-29 DIAGNOSIS — Z7189 Other specified counseling: Secondary | ICD-10-CM | POA: Diagnosis not present

## 2024-05-29 DIAGNOSIS — L538 Other specified erythematous conditions: Secondary | ICD-10-CM | POA: Diagnosis not present

## 2024-05-29 DIAGNOSIS — B078 Other viral warts: Secondary | ICD-10-CM | POA: Diagnosis not present

## 2024-06-16 ENCOUNTER — Other Ambulatory Visit: Payer: Self-pay | Admitting: Family Medicine

## 2024-06-16 DIAGNOSIS — E119 Type 2 diabetes mellitus without complications: Secondary | ICD-10-CM

## 2024-07-11 ENCOUNTER — Other Ambulatory Visit: Payer: Self-pay | Admitting: Family Medicine

## 2024-07-11 DIAGNOSIS — Z794 Long term (current) use of insulin: Secondary | ICD-10-CM

## 2024-07-11 MED ORDER — EMBECTA PEN NEEDLE NANO 2 GEN 32G X 4 MM MISC: 2 refills | Status: AC

## 2024-07-11 NOTE — Telephone Encounter (Signed)
 Prescription Request  07/11/2024  LOV: 04/18/2024  What is the name of the medication or equipment? Insulin  Pen Needle 32G X 8 MM MISC   Have you contacted your pharmacy to request a refill? Yes   Which pharmacy would you like this sent to?  [facility] - [place], [place] - [address] [place] [address] Phone: [phone] Fax: [phone]    Patient notified that their request is being sent to the clinical staff for review and that they should receive a response within 2 business days.   Please advise at [facility] [phone]

## 2024-07-16 ENCOUNTER — Telehealth: Payer: Self-pay | Admitting: Family Medicine

## 2024-07-16 DIAGNOSIS — E119 Type 2 diabetes mellitus without complications: Secondary | ICD-10-CM

## 2024-07-16 MED ORDER — LANTUS SOLOSTAR 100 UNIT/ML ~~LOC~~ SOPN: 45.0000 [IU] | PEN_INJECTOR | Freq: Two times a day (BID) | SUBCUTANEOUS | 2 refills | Status: AC

## 2024-07-16 MED ORDER — LANTUS SOLOSTAR 100 UNIT/ML ~~LOC~~ SOPN: 45.0000 [IU] | PEN_INJECTOR | Freq: Two times a day (BID) | SUBCUTANEOUS | 1 refills | Status: DC

## 2024-07-16 NOTE — Addendum Note (Signed)
 Addended by: Yosgar Demirjian E on: 07/16/2024 05:20 PM   Modules accepted: Orders

## 2024-07-16 NOTE — Telephone Encounter (Signed)
 Patient states that his Lantus  was sent to the wrong Pharmacy. It needs to be sent through [facility] and not [facility]

## 2024-08-20 ENCOUNTER — Ambulatory Visit: Admitting: Family Medicine
